# Patient Record
Sex: Male | Born: 1940 | Race: White | Hispanic: No | Marital: Married | Smoking: Never smoker
Health system: Southern US, Community
[De-identification: ages and names within clinical notes are randomized; demographics above are authoritative.]

## PROBLEM LIST (undated history)

## (undated) DIAGNOSIS — I442 Atrioventricular block, complete: Secondary | ICD-10-CM

## (undated) DIAGNOSIS — Z950 Presence of cardiac pacemaker: Secondary | ICD-10-CM

## (undated) DIAGNOSIS — E781 Pure hyperglyceridemia: Secondary | ICD-10-CM

## (undated) DIAGNOSIS — I10 Essential (primary) hypertension: Secondary | ICD-10-CM

## (undated) DIAGNOSIS — I495 Sick sinus syndrome: Secondary | ICD-10-CM

## (undated) HISTORY — DX: Pure hyperglyceridemia: E78.1

## (undated) HISTORY — DX: Essential (primary) hypertension: I10

## (undated) HISTORY — DX: Sick sinus syndrome: I49.5

## (undated) HISTORY — DX: Presence of cardiac pacemaker: Z95.0

## (undated) HISTORY — PX: PACEMAKER INSERTION: SHX728

## (undated) HISTORY — DX: Atrioventricular block, complete: I44.2

---

## 2001-01-31 ENCOUNTER — Emergency Department (HOSPITAL_COMMUNITY): Admission: EM | Admit: 2001-01-31 | Discharge: 2001-02-01 | Payer: Self-pay | Admitting: Emergency Medicine

## 2003-05-25 ENCOUNTER — Ambulatory Visit (HOSPITAL_COMMUNITY): Admission: RE | Admit: 2003-05-25 | Discharge: 2003-05-25 | Payer: Self-pay | Admitting: Gastroenterology

## 2003-05-25 ENCOUNTER — Encounter (INDEPENDENT_AMBULATORY_CARE_PROVIDER_SITE_OTHER): Payer: Self-pay | Admitting: Specialist

## 2004-09-07 ENCOUNTER — Ambulatory Visit: Payer: Self-pay

## 2004-10-04 ENCOUNTER — Ambulatory Visit: Payer: Self-pay

## 2004-10-29 ENCOUNTER — Ambulatory Visit: Payer: Self-pay | Admitting: Internal Medicine

## 2004-11-29 ENCOUNTER — Ambulatory Visit: Payer: Self-pay | Admitting: Internal Medicine

## 2005-01-02 ENCOUNTER — Ambulatory Visit: Payer: Self-pay | Admitting: Internal Medicine

## 2005-01-27 ENCOUNTER — Ambulatory Visit: Payer: Self-pay | Admitting: Internal Medicine

## 2005-03-17 ENCOUNTER — Ambulatory Visit: Payer: Self-pay | Admitting: Internal Medicine

## 2005-04-18 ENCOUNTER — Ambulatory Visit: Payer: Self-pay | Admitting: Internal Medicine

## 2005-05-24 ENCOUNTER — Ambulatory Visit: Payer: Self-pay | Admitting: Internal Medicine

## 2005-07-06 ENCOUNTER — Ambulatory Visit: Payer: Self-pay | Admitting: Internal Medicine

## 2005-09-27 ENCOUNTER — Ambulatory Visit: Payer: Self-pay | Admitting: Internal Medicine

## 2005-10-31 ENCOUNTER — Ambulatory Visit: Payer: Self-pay | Admitting: Internal Medicine

## 2005-12-06 ENCOUNTER — Ambulatory Visit: Payer: Self-pay | Admitting: Internal Medicine

## 2006-01-02 ENCOUNTER — Ambulatory Visit: Payer: Self-pay | Admitting: Internal Medicine

## 2006-02-09 ENCOUNTER — Ambulatory Visit: Payer: Self-pay | Admitting: Internal Medicine

## 2006-03-16 ENCOUNTER — Ambulatory Visit: Payer: Self-pay | Admitting: Internal Medicine

## 2006-04-13 ENCOUNTER — Ambulatory Visit: Payer: Self-pay | Admitting: Internal Medicine

## 2006-06-22 ENCOUNTER — Ambulatory Visit: Payer: Self-pay | Admitting: Internal Medicine

## 2006-07-30 ENCOUNTER — Ambulatory Visit: Payer: Self-pay | Admitting: Internal Medicine

## 2006-08-27 ENCOUNTER — Ambulatory Visit: Payer: Self-pay | Admitting: Internal Medicine

## 2006-09-18 ENCOUNTER — Ambulatory Visit: Payer: Self-pay | Admitting: Internal Medicine

## 2006-10-22 ENCOUNTER — Ambulatory Visit: Payer: Self-pay | Admitting: Internal Medicine

## 2006-11-19 ENCOUNTER — Ambulatory Visit: Payer: Self-pay | Admitting: Internal Medicine

## 2006-12-17 ENCOUNTER — Ambulatory Visit: Payer: Self-pay | Admitting: Internal Medicine

## 2007-01-16 ENCOUNTER — Ambulatory Visit: Payer: Self-pay | Admitting: Internal Medicine

## 2007-02-11 ENCOUNTER — Ambulatory Visit: Payer: Self-pay | Admitting: Internal Medicine

## 2007-03-11 ENCOUNTER — Ambulatory Visit: Payer: Self-pay | Admitting: Internal Medicine

## 2007-04-08 ENCOUNTER — Ambulatory Visit: Payer: Self-pay | Admitting: Internal Medicine

## 2007-05-06 ENCOUNTER — Ambulatory Visit: Payer: Self-pay | Admitting: Internal Medicine

## 2007-06-08 ENCOUNTER — Ambulatory Visit: Payer: Self-pay | Admitting: Internal Medicine

## 2007-07-01 ENCOUNTER — Ambulatory Visit: Payer: Self-pay | Admitting: Internal Medicine

## 2007-07-29 ENCOUNTER — Ambulatory Visit: Payer: Self-pay | Admitting: Internal Medicine

## 2007-08-26 ENCOUNTER — Ambulatory Visit: Payer: Self-pay | Admitting: Internal Medicine

## 2007-09-25 ENCOUNTER — Ambulatory Visit: Payer: Self-pay | Admitting: Internal Medicine

## 2007-10-21 ENCOUNTER — Ambulatory Visit: Payer: Self-pay | Admitting: Internal Medicine

## 2008-01-20 ENCOUNTER — Ambulatory Visit: Payer: Self-pay | Admitting: Internal Medicine

## 2008-03-16 ENCOUNTER — Ambulatory Visit: Payer: Self-pay

## 2008-03-30 ENCOUNTER — Ambulatory Visit: Payer: Self-pay | Admitting: Internal Medicine

## 2008-06-01 ENCOUNTER — Ambulatory Visit: Payer: Self-pay | Admitting: Internal Medicine

## 2008-07-06 ENCOUNTER — Ambulatory Visit: Payer: Self-pay | Admitting: Internal Medicine

## 2008-08-04 ENCOUNTER — Ambulatory Visit: Payer: Self-pay | Admitting: Internal Medicine

## 2008-08-31 ENCOUNTER — Ambulatory Visit: Payer: Self-pay | Admitting: Internal Medicine

## 2008-09-25 ENCOUNTER — Ambulatory Visit: Payer: Self-pay | Admitting: Internal Medicine

## 2008-10-26 ENCOUNTER — Encounter (INDEPENDENT_AMBULATORY_CARE_PROVIDER_SITE_OTHER): Payer: Self-pay | Admitting: *Deleted

## 2008-10-28 DIAGNOSIS — Z950 Presence of cardiac pacemaker: Secondary | ICD-10-CM | POA: Insufficient documentation

## 2008-10-28 DIAGNOSIS — I442 Atrioventricular block, complete: Secondary | ICD-10-CM

## 2008-10-28 DIAGNOSIS — I10 Essential (primary) hypertension: Secondary | ICD-10-CM

## 2008-11-02 ENCOUNTER — Ambulatory Visit: Payer: Self-pay | Admitting: Internal Medicine

## 2008-12-04 ENCOUNTER — Ambulatory Visit: Payer: Self-pay | Admitting: Internal Medicine

## 2008-12-28 ENCOUNTER — Encounter: Payer: Self-pay | Admitting: Internal Medicine

## 2008-12-28 ENCOUNTER — Ambulatory Visit: Payer: Self-pay

## 2009-01-18 ENCOUNTER — Ambulatory Visit: Payer: Self-pay | Admitting: Internal Medicine

## 2009-01-18 ENCOUNTER — Encounter: Payer: Self-pay | Admitting: Internal Medicine

## 2009-01-20 ENCOUNTER — Ambulatory Visit: Payer: Self-pay | Admitting: Internal Medicine

## 2009-01-21 LAB — CONVERTED CEMR LAB
Basophils Absolute: 0 10*3/uL (ref 0.0–0.1)
CO2: 30 meq/L (ref 19–32)
Calcium: 8.9 mg/dL (ref 8.4–10.5)
Creatinine, Ser: 0.9 mg/dL (ref 0.4–1.5)
GFR calc non Af Amer: 88.94 mL/min (ref >60)
HCT: 39.8 % (ref 39.0–52.0)
Hemoglobin: 14 g/dL (ref 13.0–17.0)
INR: 1.1 — ABNORMAL HIGH (ref 0.8–1.0)
Lymphs Abs: 1.1 10*3/uL (ref 0.7–4.0)
MCHC: 35.1 g/dL (ref 30.0–36.0)
MCV: 83.8 fL (ref 78.0–100.0)
Monocytes Absolute: 0.6 10*3/uL (ref 0.1–1.0)
Monocytes Relative: 10.7 % (ref 3.0–12.0)
Neutro Abs: 3.8 10*3/uL (ref 1.4–7.7)
Platelets: 226 10*3/uL (ref 150.0–400.0)
RDW: 12.6 % (ref 11.5–14.6)
Sodium: 141 meq/L (ref 135–145)
aPTT: 30.1 s — ABNORMAL HIGH (ref 21.7–28.8)

## 2009-01-25 ENCOUNTER — Ambulatory Visit: Payer: Self-pay | Admitting: Internal Medicine

## 2009-01-25 ENCOUNTER — Ambulatory Visit (HOSPITAL_COMMUNITY): Admission: RE | Admit: 2009-01-25 | Discharge: 2009-01-25 | Payer: Self-pay | Admitting: Internal Medicine

## 2009-02-10 ENCOUNTER — Ambulatory Visit: Payer: Self-pay | Admitting: Internal Medicine

## 2009-03-31 ENCOUNTER — Ambulatory Visit: Payer: Self-pay

## 2009-03-31 ENCOUNTER — Telehealth: Payer: Self-pay | Admitting: Internal Medicine

## 2009-04-13 ENCOUNTER — Ambulatory Visit: Payer: Self-pay | Admitting: Internal Medicine

## 2010-02-08 ENCOUNTER — Ambulatory Visit: Payer: Self-pay | Admitting: Internal Medicine

## 2010-08-05 ENCOUNTER — Encounter (INDEPENDENT_AMBULATORY_CARE_PROVIDER_SITE_OTHER): Payer: Self-pay | Admitting: *Deleted

## 2010-08-15 ENCOUNTER — Ambulatory Visit: Payer: Self-pay

## 2010-08-15 ENCOUNTER — Encounter: Payer: Self-pay | Admitting: Internal Medicine

## 2010-11-08 NOTE — Procedures (Signed)
Summary: device check   Current Medications (verified): 1)  Norvasc 10 Mg Tabs (Amlodipine Besylate) .... Take 1 Tablet By Mouth Once A Day 2)  Hyzaar 50-12.5 Mg Tabs (Losartan Potassium-Hctz) .... Take 1 Tablet By Mouth Once Daily 3)  Aspirin 81 Mg Tabs (Aspirin) .... Two Tablets Once Daily  Allergies (verified): 1)  * Penicillin  PPM Specifications Following MD:  [name], MD     PPM Vendor:  Medtronic     PPM Model Number:  ADDRL1     PPM Serial Number:  [id number] PPM DOI:  01/25/2009     PPM Implanting MD:  [name], MD  Lead 1    Location: RA     DOI: 01/20/1997     Model #: 1342T     Serial #: [id number]     Status: active Lead 2    Location: RV     DOI: 01/20/1997     Model #: 1342T     Serial #: [id number]     Status: active  Magnet Response Rate:  BOL85 ERI 65  Indications:  2:1HB  Explantation Comments:  pacer dependent 01/25/2009 Thera [id number]/[id number] h explanted  PPM Follow Up Battery Voltage:  2.79 V     Battery Est. Longevity:  11.5 yrs     Pacer Dependent:  Yes       PPM Device Measurements Atrium  Amplitude: PACED mV, Impedance: 424 ohms, Threshold: 0.50 V at 0.40 msec Right Ventricle  Amplitude: PACED mV, Impedance: 694 ohms, Threshold: 0.750 V at 0.40 msec  Episodes MS Episodes:  0     Coumadin:  No Ventricular High Rate:  0     Atrial Pacing:  95.4%     Ventricular Pacing:  99.9%  Parameters Mode:  DDDR     Lower Rate Limit:  60     Upper Rate Limit:  140 Paced AV Delay:  150     Sensed AV Delay:  120 Next Cardiology Appt Due:  02/07/2011 Tech Comments:  NORMAL DEVICE FUNCTION.  PACER DEPENDENT ON TODAYS CHECK.  CHANGED RA OUTPUT FROM 1.5 TO 2.0 AND RV OUTPUT FROM 2.0 TO 2.5 V.  ROV IN 6 MTHS W/SK.  Vella Kohler  August 15, 2010 9:30 AM

## 2010-11-08 NOTE — Assessment & Plan Note (Signed)
Summary: pacer check   Visit Type:  Pacemaker check Primary Provider:  dr.John Valentina Lucks  CC:  no complaints.  History of Present Illness: Luke Schmidt is seen following pacemaker implantation years ago for complete heart block; He also has intermittent sinus node arrest.  He underwent device generator replacement about a year ago.  At that time preprocedural echo demonstrated an ejection fraction of 60%  The patient denies SOB, chest pain, edema or palpitations   Current Medications (verified): 1)  Norvasc 10 Mg Tabs (Amlodipine Besylate) .... Take 1 Tablet By Mouth Once A Day 2)  Hyzaar 50-12.5 Mg Tabs (Losartan Potassium-Hctz) .... Verify Dosage 3)  Aspirin 81 Mg Tabs (Aspirin) .... Two Tablets Once Daily  Allergies (verified): 1)  * Penicillin  Past History:  Past Medical History: Last updated: 10/28/2008 Complete Heart Block Sinus Node Dysfunction Hypertension status post Medtronic pacemaker hypertriglyceridemia  Vital Signs:  Patient profile:   70 year old male Height:      72 inches Weight:      209 pounds BMI:     28.45 Pulse rate:   73 / minute BP sitting:   126 / 78  (left arm) Cuff size:   large  Vitals Entered By: Luke Kanaris, CNA (Feb 08, 2010 3:08 PM)  Physical Exam  General:  The patient was alert and oriented in no acute distress. HEENT Normal.  Neck veins were flat, carotids were brisk.  Lungs were clear.  Heart sounds were regular without murmurs or gallops.  Abdomen was soft with active bowel sounds. There is no clubbing cyanosis or edema. Skin Warm and dry    PPM Specifications Following MD:  [name], MD     PPM Vendor:  Medtronic     PPM Model Number:  ADDRL1     PPM Serial Number:  [id number] PPM DOI:  01/25/2009     PPM Implanting MD:  [name], MD  Lead 1    Location: RA     DOI: 01/20/1997     Model #: 1342T     Serial #: [id number]     Status: active Lead 2    Location: RV     DOI: 01/20/1997     Model #: 1342T     Serial  #: [id number]     Status: active  Magnet Response Rate:  BOL85 ERI 65  Indications:  2:1HB  Explantation Comments:  pacer dependent 01/25/2009 Thera 7950i/[id number] h explanted  PPM Follow Up Remote Check?  No Battery Voltage:  2.79 V     Battery Est. Longevity:  12.5 years     Pacer Dependent:  Yes       PPM Device Measurements Atrium  Impedance: 424 ohms, Threshold: 0.5 V at 0.4 msec Right Ventricle  Impedance: 665 ohms, Threshold: 0.625 V at 0.4 msec  Episodes MS Episodes:  3     Percent Mode Switch:  <0.1%     Coumadin:  No Ventricular High Rate:  0     Atrial Pacing:  93.9%     Ventricular Pacing:  100%  Parameters Mode:  DDDR     Lower Rate Limit:  60     Upper Rate Limit:  140 Paced AV Delay:  150     Sensed AV Delay:  120 Next Cardiology Appt Due:  08/09/2010 Tech Comments:  No parameter changes.  Device function normal.  No Carelink @ this time.  ROV 6 months clinic. Altha Harm, LPN  Feb 09, 8412 3:14 PM  Impression & Recommendations:  Problem # 1:  AV BLOCK, COMPLETE (ICD-426.0) Stable with nearly 100% ventricular pacing His updated medication list for this problem includes:    Norvasc 10 Mg Tabs (Amlodipine besylate) .Marland Kitchen... Take 1 tablet by mouth once a day    Aspirin 81 Mg Tabs (Aspirin) .Marland Kitchen..Marland Kitchen Two tablets once daily  His updated medication list for this problem includes:    Norvasc 10 Mg Tabs (Amlodipine besylate) .Marland Kitchen... Take 1 tablet by mouth once a day    Aspirin 81 Mg Tabs (Aspirin) .Marland Kitchen..Marland Kitchen Two tablets once daily  Problem # 2:  PACEMAKER (ICD-V45.Marland Kitchen01) Device parameters and data were reviewed and no changes were made  We also spent time reviewing the potential issues related to permanent pacing specifically pacing induced cardiomyopathy. Review the results of his echo from a year ago demonstrating normal left ventricular function prior to generator replacement and we anticipate repeating the ultrasound at his visit next year  Problem # 3:  HYPERTENSION, BENIGN  (ICD-401.1) stable His updated medication list for this problem includes:    Norvasc 10 Mg Tabs (Amlodipine besylate) .Marland Kitchen... Take 1 tablet by mouth once a day    Hyzaar 50-12.5 Mg Tabs (Losartan potassium-hctz) .Marland Kitchen... Verify dosage    Aspirin 81 Mg Tabs (Aspirin) .Marland Kitchen..Marland Kitchen Two tablets once daily  Patient Instructions: 1)  Followup in 6 months in the device clinic. 2)  Your physician wants you to follow-up in:  1 year with Dr. Graciela Husbands. You will receive a reminder letter in the mail two months in advance. If you don't receive a letter, please call our office to schedule the follow-up appointment.

## 2010-11-08 NOTE — Miscellaneous (Signed)
Summary: correcting codes  Clinical Lists Changes  Problems: Changed problem from PACEMAKER (ICD-V45.Marland Kitchen01) to PACEMAKER, PERMANENT (ICD-V45.01) changed the incorrect dx code to correct dx code Genella Mech  August 05, 2010 9:59 AM

## 2010-11-08 NOTE — Cardiovascular Report (Signed)
Summary: Office Visit   Office Visit   Imported By: Roderic Ovens 08/18/2010 14:29:37  _____________________________________________________________________  External Attachment:    Type:   Image     Comment:   External Document

## 2011-02-21 NOTE — Assessment & Plan Note (Signed)
[facility]                         ELECTROPHYSIOLOGY OFFICE NOTE   DIANE, MOCHIZUKI                    MRN:          [id number]  DATE:09/25/2008                            DOB:          1941/06/28    HISTORY OF PRESENT ILLNESS:  Mr. Titsworth is seen in followup for a  pacemaker implanted for 2-1 heart block.  He is now become device-  dependent.  He has no history of coronary artery disease.  He has had no  problems with chest pain, shortness of breath, or changes in exercise  tolerance.   MEDICATION:  His only medications includes Hyzaar 100/25, Norvasc 10,  and aspirin.   PHYSICAL EXAMINATION:  VITAL SIGNS:  His blood pressure is well-  controlled at 128/77, his weight is 211, which is up 15 pounds in the  last 2 years, his pulse was 59.  LUNGS:  Clear.  NECK:  Veins were flat.  HEART:  Sounds were regular.  EXTREMITIES:  Without edema.   IMPRESSION:  1. 2:1 heart block, now device dependent.  2. Status post Medtronic pacer for the above, now approaching the      elective replacement indicator with approximately 4 months of      longevity.  3. Hypertension.   PLAN:  We will plan to check an echo at his next visit in 3 months, as  he approaches ERI to make sure there is no significant changes in LV  function related to pacing.   His blood pressure is well controlled.  I should note that his impedance  on A lead 425 in the V lead over 643.     Duke Salvia, MD, [facility]  Electronically Signed    SCK/MedQ  DD: 09/25/2008  DT: 09/25/2008  Job #: [id number]

## 2011-02-21 NOTE — Letter (Signed)
September 25, 2007    Thora Lance, M.D.  [address]  [place], [place] [address]   RE:  Luke Schmidt, Luke Schmidt  MRN:  [id number]  /  DOB:  [birth year]   Dear Luke Schmidt,   I hope this letter finds you well and hope you and your family have a  Merry Christmas.   Luke Schmidt comes in today.  He is now ten years status post pacemaker  implantation for high-grade heart block and he remains device-dependent.  He has some degree of chronotropic incompetence, but no limitations in  his functional capacity.   His medications today include Hyzaar 100, presumably/12.5, and Norvasc  10.   EXAMINATION:  His blood pressure is elevated at 148/89 and, as I talk to  him at home, he typically runs in the 145/85 range.  His lungs were clear.  Heart sounds were regular.  The extremities were without edema.   Interrogation of his Medtronic CR pulse generator demonstrates a P-wave  that was not recordable today.  The impedance was 438, a threshold of  0.5 at 0.4.  That was also the ventricular threshold with impedance of  646 and there is NO INTRINSIC VENTRICULAR RHYTHM.   IMPRESSION:  1. Complete heart block.  2. Intermittent sinus node arrest.  3. Status post pacemaker for #1 and #2.  4. Hypertension - persistent.   Luke Schmidt, Mr. Kana's pacemaker is doing well.  I have asked him to try  and get a dozen or two measurements of his blood pressure, so that, when  he comes to see you next month, you will have a chance to review some  ongoing data.  I am concerned that his blood pressure in the 145/85  range is probably too high for him, given his age, and so I wanted him  to get some data points that might facilitate your care of that.   We will see him again in six months' time.    Sincerely,      Luke Salvia, MD, [facility]  Electronically Signed    SCK/MedQ  DD: 09/25/2007  DT: 09/25/2007  Job #: [id number]

## 2011-02-24 NOTE — Op Note (Signed)
NAME:  UNIQUE, SEARFOSS NO.:  [id number]   MEDICAL RECORD NO.:  [id number]          PATIENT TYPE:  OIB   LOCATION:  2899                         FACILITY:  [facility]   PHYSICIAN:  Duke Salvia, MD, FACCDATE OF BIRTH:  03-16-1941   DATE OF PROCEDURE:  02/24/2009  DATE OF DISCHARGE:  01/25/2009                               OPERATIVE REPORT   SURGEON:  Duke Salvia, MD, [facility]   PREOPERATIVE DIAGNOSIS:  Previously implanted pacemaker, now at elective  replacement indicator.   POSTOPERATIVE DIAGNOSIS:  Previously implanted pacemaker, now at  elective replacement indicator with underlying complete heart block.   Following obtaining informed consent, the patient was brought to the  Electrophysiology Laboratory and placed on the fluoroscopic table in a  supine position.  After routine prep and drape, lidocaine was  infiltrated along the line of previous incision and carried down to the  device pocket.  The pocket was opened up.  The previously implanted  device was explanted.  Interrogation of the atrial lead demonstrated a  1342 lead with an amplitude of 7.1, impedance of 402, a threshold of 0.3  at 0.5, current threshold is 1.0 MA.  The RV lead had no intrinsic  rhythm.  The impedance was 656, threshold was 0.5 at 0.5 with a current  of 0.8 MA.  The leads were attached to an Adapta pulse generator, serial  number [id number].  Ventricular pacing and then P synchronous pacing  were identified.  The pocket was copiously irrigated with antibiotic-  containing saline solution.  Hemostasis was assured.  Leads and pulse  generator were placed in the pocket, secured to the prepectoral fascia.  The wound was closed in 3 layers in normal fashion.  The wound was  washed, dried, and a benzoin Steri-Strip dressing was applied.  Needle  counts, sponge counts, and instrument counts were correct at the end of  the procedure according to the staff.  The patient tolerated the  procedure without apparent complication.      Duke Salvia, MD, [facility]  Electronically Signed     SCK/MEDQ  D:  02/24/2009  T:  02/25/2009  Job:  [id number]

## 2011-02-24 NOTE — Op Note (Signed)
   NAME:  Luke Schmidt, Luke Schmidt                       ACCOUNT NO.:  [id number]   MEDICAL RECORD NO.:  [id number]                   PATIENT TYPE:  AMB   LOCATION:  ENDO                                 [facility]ACILITY:  [facility]   PHYSICIAN:  Danise Edge, M.D.                DATE OF BIRTH:  01-29-1941   DATE OF PROCEDURE:  05/25/2003  DATE OF DISCHARGE:                                 OPERATIVE REPORT   PROCEDURE:  Screening colonoscopy with polypectomy.   PROCEDURE INDICATION:  Luke Schmidt is a 70 year old male born Feb 07, 1941.  Mr. Luke Schmidt is scheduled to undergo his first screening colonoscopy  with polypectomy to prevent colon cancer.   ENDOSCOPIST:  Danise Edge, M.D.   PREMEDICATION:  Versed 7.5 mg, Demerol 50 mg.   PROCEDURE:  After obtaining informed consent, Mr. Luke Schmidt was placed in  the left lateral decubitus position.  I administered intravenous Demerol and  intravenous Versed to achieve conscious sedation for the procedure.  The  patient's blood pressure, oxygen saturation, and cardiac rhythm were  monitored throughout the procedure and documented in the medical record.   Anal inspection was normal.  Digital rectal exam was normal.  The prostate  was non-nodular.  The Olympus adult colonoscope was introduced into the  rectum and easily advanced to the cecum.  Colonic preparation for the exam  today was excellent.   RECTUM:  Normal.   SIGMOID COLON AND DESCENDING COLON:  Normal.   SPLENIC FLEXURE:  Normal.   TRANSVERSE COLON:  Normal.   HEPATIC FLEXURE:  Normal.   ASCENDING COLON:  Normal.   CECUM AND ILEOCECAL VALVE:  A 2 mm sessile polyp was removed from the  proximal cecum with the electrocautery snare and submitted for pathological  interpretation.   ASSESSMENT:  A 2 mm diameter polyp was removed from the proximal cecum;  otherwise, normal proctocolonoscopy to the cecum.    RECOMMENDATIONS:  If cecal polyp returns neoplastic pathologically, Mr.  Schmidt should undergo a repeat colonoscopy in five years.                                               Danise Edge, M.D.    MJ/MEDQ  D:  05/25/2003  T:  05/25/2003  Job:  [id number]   cc:   Thora Lance, M.D.  [address]  [place]  [place] [address]  Fax: [phone]

## 2011-02-24 NOTE — Letter (Signed)
September 18, 2006    Thora Lance, M.D.  [address]  [place], [place] [address]   RE:  Luke Schmidt, Luke Schmidt  MRN:  [id number]  /  DOB:  [birth year]   Dear Jonny Ruiz:   I hope this letter finds you and your family well at Christmas time.  Mr. Kuiken came in today.  his pacemaker is doing fine.  It really is  having remarkable  longevity.  He has no complaints.  The issue really  is his blood pressure, which today was 148/83 and has been typically  high.  He mentions it was high when you saw him in June, at which time  you increased his Hyzaar.  It is now at 100 mg a day.  He is also taking  Norvasc 10 mg.   Otherwise, his lungs are clear.  Heart sounds are regular.  EXTREMITIES:  Without edema.   Interrogation of his Medtronic device demonstrated an estimated  longevity is 26 months, which I think is pretty unbelievable.  His  battery voltage is 2.76, atrial impedence was 453, ventricular impedence  was 642, and he is device dependent.  Heart rate excursion was adequate.   IMPRESSION:  1. Complete heart block.  2. Status post pacer for #1.  3. Hypertension.   We discussed non-medicinal interventions, like salt and exercise, both  of which he is trying to do a pretty good job on.  He does not think he  snores.   As he is already on 3 medicines, I suggested that it did not make sense  for me to do anything urgently at this point, but he is supposed to see  you next month and I did want to alert you to his blood pressure.   We will see him again in 1 year and will follow his device remotely in  the interim.    Sincerely,      Duke Salvia, MD, [facility]  Electronically Signed    SCK/MedQ  DD: 09/18/2006  DT: 09/18/2006  Job #: [id number]

## 2011-02-26 ENCOUNTER — Other Ambulatory Visit: Payer: Self-pay | Admitting: Internal Medicine

## 2011-04-03 ENCOUNTER — Encounter: Payer: Self-pay | Admitting: Internal Medicine

## 2011-05-30 ENCOUNTER — Ambulatory Visit (INDEPENDENT_AMBULATORY_CARE_PROVIDER_SITE_OTHER): Payer: Medicare Other | Admitting: Internal Medicine

## 2011-05-30 ENCOUNTER — Encounter: Payer: Self-pay | Admitting: Internal Medicine

## 2011-05-30 VITALS — BP 142/86 | HR 74 | Ht 72.5 in | Wt 213.0 lb

## 2011-05-30 DIAGNOSIS — I495 Sick sinus syndrome: Secondary | ICD-10-CM | POA: Insufficient documentation

## 2011-05-30 DIAGNOSIS — Z950 Presence of cardiac pacemaker: Secondary | ICD-10-CM

## 2011-05-30 DIAGNOSIS — I442 Atrioventricular block, complete: Secondary | ICD-10-CM

## 2011-05-30 LAB — PACEMAKER DEVICE OBSERVATION
AL IMPEDENCE PM: 407 Ohm
AL THRESHOLD: 0.5 V
ATRIAL PACING PM: 97
BAMS-0001: 175 {beats}/min
RV LEAD IMPEDENCE PM: 697 Ohm
VENTRICULAR PACING PM: 100

## 2011-05-30 NOTE — Assessment & Plan Note (Signed)
100% atrial paced

## 2011-05-30 NOTE — Progress Notes (Signed)
  HPI  Luke Schmidt is a 70 y.o. male Seen in followup for pacemaker implantation years ago for complete heart block. He recently underwent generator replacement. The patient denies chest pain, shortness of breath, nocturnal dyspnea, orthopnea or peripheral edema.  There have been no palpitations, lightheadedness or syncope.    Past Medical History  Diagnosis Date  . Complete heart block   . Sinus node dysfunction   . HTN (hypertension)   . Hypertriglyceridemia     Past Surgical History  Procedure Date  . Pacemaker insertion     Medtronic     Current Outpatient Prescriptions  Medication Sig Dispense Refill  . amLODipine (NORVASC) 10 MG tablet TAKE 1 TABLET BY MOUTH ONCE DAILY  30 tablet  4  . aspirin 81 MG EC tablet Take 162 mg by mouth daily.        Marland Kitchen losartan-hydrochlorothiazide (HYZAAR) 50-12.5 MG per tablet Take 1 tablet by mouth daily.          Allergies  Allergen Reactions  . Penicillins     Review of Systems negative except from HPI and PMH  Physical Exam Well developed and well nourished in no acute distress HENT normal E scleral and icterus clear Neck Supple JVP flat; carotids brisk and full Clear to ausculation Regular rate and rhythm, no murmurs gallops or rub Soft with active bowel sounds No clubbing cyanosis and edema Alert and oriented, grossly normal motor and sensory function Skin Warm and Dry   Assessment and  Plan

## 2011-05-30 NOTE — Assessment & Plan Note (Addendum)
The patient's device was interrogated.  The information was reviewed. No changes were made in the programming.    Will begin carelink

## 2011-05-30 NOTE — Assessment & Plan Note (Signed)
Stable

## 2011-07-29 ENCOUNTER — Other Ambulatory Visit: Payer: Self-pay | Admitting: Internal Medicine

## 2011-09-01 ENCOUNTER — Encounter: Payer: Medicare Other | Admitting: *Deleted

## 2011-09-02 ENCOUNTER — Encounter: Payer: Self-pay | Admitting: *Deleted

## 2011-09-11 ENCOUNTER — Ambulatory Visit (INDEPENDENT_AMBULATORY_CARE_PROVIDER_SITE_OTHER): Payer: Medicare Other | Admitting: *Deleted

## 2011-09-11 ENCOUNTER — Encounter: Payer: Self-pay | Admitting: Internal Medicine

## 2011-09-11 ENCOUNTER — Other Ambulatory Visit: Payer: Self-pay | Admitting: Internal Medicine

## 2011-09-11 DIAGNOSIS — I495 Sick sinus syndrome: Secondary | ICD-10-CM

## 2011-09-11 DIAGNOSIS — I442 Atrioventricular block, complete: Secondary | ICD-10-CM

## 2011-09-11 DIAGNOSIS — Z950 Presence of cardiac pacemaker: Secondary | ICD-10-CM

## 2011-09-13 LAB — REMOTE PACEMAKER DEVICE
AL IMPEDENCE PM: 413 Ohm
ATRIAL PACING PM: 97
BAMS-0001: 175 {beats}/min
RV LEAD IMPEDENCE PM: 730 Ohm
VENTRICULAR PACING PM: 99

## 2011-09-13 NOTE — Progress Notes (Signed)
Remote pacer check  

## 2011-09-14 ENCOUNTER — Encounter: Payer: Self-pay | Admitting: *Deleted

## 2011-12-14 ENCOUNTER — Ambulatory Visit (INDEPENDENT_AMBULATORY_CARE_PROVIDER_SITE_OTHER): Payer: Medicare Other | Admitting: *Deleted

## 2011-12-14 ENCOUNTER — Encounter: Payer: Self-pay | Admitting: Internal Medicine

## 2011-12-14 DIAGNOSIS — I495 Sick sinus syndrome: Secondary | ICD-10-CM

## 2011-12-14 DIAGNOSIS — I442 Atrioventricular block, complete: Secondary | ICD-10-CM

## 2011-12-15 LAB — REMOTE PACEMAKER DEVICE
AL IMPEDENCE PM: 407 Ohm
ATRIAL PACING PM: 97
BATTERY VOLTAGE: 2.79 V
VENTRICULAR PACING PM: 100

## 2011-12-20 ENCOUNTER — Encounter: Payer: Self-pay | Admitting: Internal Medicine

## 2011-12-20 NOTE — Progress Notes (Signed)
Remote pacer check  

## 2011-12-26 ENCOUNTER — Encounter: Payer: Self-pay | Admitting: *Deleted

## 2011-12-31 ENCOUNTER — Other Ambulatory Visit: Payer: Self-pay | Admitting: Internal Medicine

## 2012-03-01 ENCOUNTER — Other Ambulatory Visit: Payer: Self-pay | Admitting: Dermatology

## 2012-03-14 ENCOUNTER — Encounter: Payer: Medicare Other | Admitting: *Deleted

## 2012-03-25 ENCOUNTER — Encounter: Payer: Self-pay | Admitting: *Deleted

## 2012-04-05 ENCOUNTER — Ambulatory Visit (INDEPENDENT_AMBULATORY_CARE_PROVIDER_SITE_OTHER): Payer: Medicare Other | Admitting: *Deleted

## 2012-04-05 DIAGNOSIS — I495 Sick sinus syndrome: Secondary | ICD-10-CM

## 2012-04-05 DIAGNOSIS — I442 Atrioventricular block, complete: Secondary | ICD-10-CM

## 2012-04-06 ENCOUNTER — Encounter: Payer: Self-pay | Admitting: Internal Medicine

## 2012-04-08 LAB — REMOTE PACEMAKER DEVICE
ATRIAL PACING PM: 97
BATTERY VOLTAGE: 2.79 V
VENTRICULAR PACING PM: 100

## 2012-04-19 ENCOUNTER — Encounter: Payer: Self-pay | Admitting: *Deleted

## 2012-05-28 ENCOUNTER — Other Ambulatory Visit: Payer: Self-pay | Admitting: Internal Medicine

## 2012-05-28 ENCOUNTER — Ambulatory Visit (INDEPENDENT_AMBULATORY_CARE_PROVIDER_SITE_OTHER): Payer: Medicare Other | Admitting: Internal Medicine

## 2012-05-28 ENCOUNTER — Encounter: Payer: Self-pay | Admitting: Internal Medicine

## 2012-05-28 VITALS — BP 140/84 | HR 85 | Ht 72.0 in | Wt 214.8 lb

## 2012-05-28 DIAGNOSIS — I442 Atrioventricular block, complete: Secondary | ICD-10-CM

## 2012-05-28 DIAGNOSIS — Z950 Presence of cardiac pacemaker: Secondary | ICD-10-CM

## 2012-05-28 DIAGNOSIS — I495 Sick sinus syndrome: Secondary | ICD-10-CM

## 2012-05-28 LAB — PACEMAKER DEVICE OBSERVATION
AL IMPEDENCE PM: 402 Ohm
ATRIAL PACING PM: 97.7
BAMS-0001: 175 {beats}/min

## 2012-05-28 NOTE — Assessment & Plan Note (Signed)
The patient's device was interrogated.  The information was reviewed. No changes were made in the programming.    

## 2012-05-28 NOTE — Patient Instructions (Addendum)
Remote monitoring is used to monitor your Pacemaker of ICD from home. This monitoring reduces the number of office visits required to check your device to one time per year. It allows [place] to keep an eye on the functioning of your device to ensure it is working properly. You are scheduled for a device check from home on September 02, 2012. You may send your transmission at any time that day. If you have a wireless device, the transmission will be sent automatically. After your physician reviews your transmission, you will receive a postcard with your next transmission date.  Your physician wants you to follow-up in: 1 year with Dr Graciela Husbands.  You will receive a reminder letter in the mail two months in advance. If you don't receive a letter, please call our office to schedule the follow-up appointment.  Your physician recommends that you continue on your current medications as directed. Please refer to the Current Medication list given to you today.

## 2012-05-28 NOTE — Assessment & Plan Note (Signed)
The patient's device was interrogated.  The information was reviewed. No changes were made in the programming.   stable

## 2012-05-28 NOTE — Progress Notes (Signed)
  HPI  Luke Schmidt is a 71 y.o. male Seen in followup for pacemaker implantation years ago for complete heart block. He  underwent generator replacement 2010. The patient denies chest pain, shortness of breath, nocturnal dyspnea, orthopnea or peripheral edema. There have been no palpitations, lightheadedness or syncope.    Past Medical History  Diagnosis Date  . Complete heart block   . Sinus node dysfunction   . HTN (hypertension)   . Hypertriglyceridemia   . Pacemaker     Medtronic adapta  L; generator replacement 2010    Past Surgical History  Procedure Date  . Pacemaker insertion     Medtronic     Current Outpatient Prescriptions  Medication Sig Dispense Refill  . amLODipine (NORVASC) 10 MG tablet take 1 tablet by mouth once daily  30 tablet  4  . aspirin 81 MG EC tablet Take 162 mg by mouth daily.        Marland Kitchen losartan-hydrochlorothiazide (HYZAAR) 50-12.5 MG per tablet Take 1 tablet by mouth daily.          Allergies  Allergen Reactions  . Penicillins     Review of Systems negative except from HPI and PMH  Physical Exam BP 140/84  Pulse 85  Ht 6' (1.829 m)  Wt 214 lb 12.8 oz (97.433 kg)  BMI 29.13 kg/m2 Well developed and well nourished in no acute distress HENT normal E scleral and icterus clear Neck Supple JVP flat; carotids brisk and full Clear to ausculation Regular rate and rhythm, no murmurs gallops or rub Soft with active bowel sounds No clubbing cyanosis none Edema Alert and oriented, grossly normal motor and sensory function Skin Warm and Dry  ecg AV  pacing  Assessment and  Plan

## 2012-06-27 ENCOUNTER — Other Ambulatory Visit: Payer: Self-pay | Admitting: Dermatology

## 2012-09-02 ENCOUNTER — Encounter: Payer: Medicare Other | Admitting: *Deleted

## 2012-09-02 ENCOUNTER — Other Ambulatory Visit: Payer: Self-pay | Admitting: Dermatology

## 2012-09-04 ENCOUNTER — Ambulatory Visit (INDEPENDENT_AMBULATORY_CARE_PROVIDER_SITE_OTHER): Payer: Medicare Other | Admitting: *Deleted

## 2012-09-04 ENCOUNTER — Encounter: Payer: Self-pay | Admitting: Internal Medicine

## 2012-09-04 DIAGNOSIS — Z950 Presence of cardiac pacemaker: Secondary | ICD-10-CM

## 2012-09-04 DIAGNOSIS — I441 Atrioventricular block, second degree: Secondary | ICD-10-CM

## 2012-09-04 DIAGNOSIS — I442 Atrioventricular block, complete: Secondary | ICD-10-CM

## 2012-09-10 ENCOUNTER — Encounter: Payer: Self-pay | Admitting: *Deleted

## 2012-09-16 ENCOUNTER — Other Ambulatory Visit: Payer: Self-pay | Admitting: Dermatology

## 2012-09-16 LAB — REMOTE PACEMAKER DEVICE
BAMS-0001: 175 {beats}/min
BATTERY VOLTAGE: 2.79 V
RV LEAD THRESHOLD: 0.5 V

## 2012-09-18 ENCOUNTER — Telehealth: Payer: Self-pay | Admitting: Internal Medicine

## 2012-09-18 NOTE — Telephone Encounter (Signed)
LMOM/kwm  

## 2012-09-18 NOTE — Telephone Encounter (Signed)
plz return call to pt [phone] regarding device check and questions

## 2012-09-23 NOTE — Telephone Encounter (Signed)
Transmission was received on 09-18-12/kwm

## 2012-09-24 ENCOUNTER — Encounter: Payer: Self-pay | Admitting: *Deleted

## 2012-10-28 ENCOUNTER — Other Ambulatory Visit: Payer: Self-pay

## 2012-10-28 MED ORDER — AMLODIPINE BESYLATE 10 MG PO TABS: 10.0000 mg | ORAL_TABLET | Freq: Every day | ORAL | Status: DC

## 2012-11-27 ENCOUNTER — Other Ambulatory Visit: Payer: Self-pay | Admitting: Dermatology

## 2012-12-16 ENCOUNTER — Encounter: Payer: Medicare Other | Admitting: *Deleted

## 2012-12-24 ENCOUNTER — Encounter: Payer: Self-pay | Admitting: *Deleted

## 2013-01-01 ENCOUNTER — Other Ambulatory Visit: Payer: Self-pay

## 2013-01-01 ENCOUNTER — Ambulatory Visit (INDEPENDENT_AMBULATORY_CARE_PROVIDER_SITE_OTHER): Payer: Medicare Other | Admitting: *Deleted

## 2013-01-01 ENCOUNTER — Encounter: Payer: Self-pay | Admitting: Internal Medicine

## 2013-01-01 DIAGNOSIS — Z950 Presence of cardiac pacemaker: Secondary | ICD-10-CM

## 2013-01-01 DIAGNOSIS — I442 Atrioventricular block, complete: Secondary | ICD-10-CM

## 2013-01-08 LAB — REMOTE PACEMAKER DEVICE
BATTERY VOLTAGE: 2.79 V
RV LEAD THRESHOLD: 0.5 V
VENTRICULAR PACING PM: 100

## 2013-02-06 ENCOUNTER — Encounter: Payer: Self-pay | Admitting: *Deleted

## 2013-03-05 ENCOUNTER — Other Ambulatory Visit: Payer: Self-pay | Admitting: Dermatology

## 2013-04-07 ENCOUNTER — Ambulatory Visit (INDEPENDENT_AMBULATORY_CARE_PROVIDER_SITE_OTHER): Payer: Medicare Other | Admitting: *Deleted

## 2013-04-07 ENCOUNTER — Encounter: Payer: Self-pay | Admitting: Internal Medicine

## 2013-04-07 DIAGNOSIS — Z950 Presence of cardiac pacemaker: Secondary | ICD-10-CM

## 2013-04-07 DIAGNOSIS — I442 Atrioventricular block, complete: Secondary | ICD-10-CM

## 2013-04-08 LAB — REMOTE PACEMAKER DEVICE
AL THRESHOLD: 0.5 V
BAMS-0001: 175 {beats}/min
VENTRICULAR PACING PM: 100

## 2013-04-16 ENCOUNTER — Encounter: Payer: Self-pay | Admitting: *Deleted

## 2013-05-29 ENCOUNTER — Other Ambulatory Visit: Payer: Self-pay | Admitting: *Deleted

## 2013-05-29 MED ORDER — AMLODIPINE BESYLATE 10 MG PO TABS: 10.0000 mg | ORAL_TABLET | Freq: Every day | ORAL | Status: DC

## 2013-06-17 ENCOUNTER — Encounter: Payer: Self-pay | Admitting: Internal Medicine

## 2013-06-17 ENCOUNTER — Ambulatory Visit (INDEPENDENT_AMBULATORY_CARE_PROVIDER_SITE_OTHER): Payer: Medicare Other | Admitting: Internal Medicine

## 2013-06-17 VITALS — BP 143/88 | HR 64 | Ht 72.0 in | Wt 212.2 lb

## 2013-06-17 DIAGNOSIS — I442 Atrioventricular block, complete: Secondary | ICD-10-CM

## 2013-06-17 DIAGNOSIS — I10 Essential (primary) hypertension: Secondary | ICD-10-CM

## 2013-06-17 DIAGNOSIS — Z950 Presence of cardiac pacemaker: Secondary | ICD-10-CM

## 2013-06-17 DIAGNOSIS — I495 Sick sinus syndrome: Secondary | ICD-10-CM

## 2013-06-17 LAB — PACEMAKER DEVICE OBSERVATION
AL THRESHOLD: 0.5 V
BAMS-0001: 175 {beats}/min
RV LEAD IMPEDENCE PM: 743 Ohm
RV LEAD THRESHOLD: 0.75 V
VENTRICULAR PACING PM: 100

## 2013-06-17 NOTE — Assessment & Plan Note (Signed)
The patient's device was interrogated.  The information was reviewed. No changes were made in the programming.    

## 2013-06-17 NOTE — Progress Notes (Signed)
  HPI  Luke Schmidt is a 72 y.o. male Seen in followup for pacemaker implantation years ago for complete heart block. He  underwent generator replacement 2010. The patient denies chest pain, shortness of breath, nocturnal dyspnea, orthopnea or peripheral edema. There have been no palpitations, lightheadedness or syncope.    Past Medical History  Diagnosis Date  . Complete heart block   . Sinus node dysfunction   . HTN (hypertension)   . Hypertriglyceridemia   . Pacemaker     Medtronic adapta  L; generator replacement 2010    Past Surgical History  Procedure Laterality Date  . Pacemaker insertion      Medtronic     Current Outpatient Prescriptions  Medication Sig Dispense Refill  . amLODipine (NORVASC) 10 MG tablet Take 1 tablet (10 mg total) by mouth daily.  30 tablet  0  . aspirin 81 MG EC tablet Take 162 mg by mouth daily.        Marland Kitchen losartan-hydrochlorothiazide (HYZAAR) 50-12.5 MG per tablet Take 1 tablet by mouth daily.         No current facility-administered medications for this visit.    Allergies  Allergen Reactions  . Penicillins     Review of Systems negative except from HPI and PMH  Physical Exam Well developed and nourished in no acute distress HENT normal Neck supple with JVP-flat Clear Regular rate and rhythm, no murmurs or gallops Abd-soft with active BS No Clubbing cyanosis edema Skin-warm and dry A & Oriented  Grossly normal sensory and motor function  ecg AV  pacing  Assessment and  Plan

## 2013-06-17 NOTE — Patient Instructions (Addendum)
Remote monitoring is used to monitor your Pacemaker of ICD from home. This monitoring reduces the number of office visits required to check your device to one time per year. It allows [place] to keep an eye on the functioning of your device to ensure it is working properly. You are scheduled for a device check from home on 3 months. You may send your transmission at any time that day. If you have a wireless device, the transmission will be sent automatically. After your physician reviews your transmission, you will receive a postcard with your next transmission date.   Your physician wants you to follow-up in: one year with Dr. Graciela Husbands . You will receive a reminder letter in the mail two months in advance. If you don't receive a letter, please call our office to schedule the follow-up appointment.   Your physician recommends that you continue on your current medications as directed. Please refer to the Current Medication list given to you today.

## 2013-06-17 NOTE — Assessment & Plan Note (Signed)
Stable and dependent

## 2013-06-17 NOTE — Assessment & Plan Note (Signed)
100% atrially paced. He has no limitations in exercise tolerance. His heart rate response was somewhat blunted but given the absence of symptoms unchanged

## 2013-06-17 NOTE — Assessment & Plan Note (Signed)
Well controlled. He is on a diuretic and his potassium level was apparently low. Potassium repletion was ordered. Those labs are pending.

## 2013-06-23 ENCOUNTER — Encounter: Payer: Self-pay | Admitting: *Deleted

## 2013-07-01 ENCOUNTER — Other Ambulatory Visit: Payer: Self-pay

## 2013-07-01 MED ORDER — AMLODIPINE BESYLATE 10 MG PO TABS: 10.0000 mg | ORAL_TABLET | Freq: Every day | ORAL | Status: DC

## 2013-07-09 ENCOUNTER — Encounter: Payer: Self-pay | Admitting: Internal Medicine

## 2013-08-14 ENCOUNTER — Other Ambulatory Visit: Payer: Self-pay

## 2013-09-22 ENCOUNTER — Ambulatory Visit (INDEPENDENT_AMBULATORY_CARE_PROVIDER_SITE_OTHER): Payer: Medicare Other | Admitting: *Deleted

## 2013-09-22 DIAGNOSIS — I442 Atrioventricular block, complete: Secondary | ICD-10-CM

## 2013-09-22 DIAGNOSIS — Z950 Presence of cardiac pacemaker: Secondary | ICD-10-CM

## 2013-09-22 DIAGNOSIS — I495 Sick sinus syndrome: Secondary | ICD-10-CM

## 2013-09-23 LAB — MDC_IDC_ENUM_SESS_TYPE_REMOTE
Battery Remaining Longevity: 83 mo
Brady Statistic AP VP Percent: 100 %
Brady Statistic AP VS Percent: 0 %
Brady Statistic AS VP Percent: 0 %
Date Time Interrogation Session: 20141216014422
Lead Channel Impedance Value: 418 Ohm
Lead Channel Impedance Value: 703 Ohm
Lead Channel Pacing Threshold Amplitude: 0.5 V
Lead Channel Pacing Threshold Amplitude: 0.5 V
Lead Channel Pacing Threshold Pulse Width: 0.4 ms
Lead Channel Setting Pacing Amplitude: 2.5 V
Lead Channel Setting Sensing Sensitivity: 2.8 mV

## 2013-10-22 ENCOUNTER — Encounter: Payer: Self-pay | Admitting: *Deleted

## 2013-10-23 ENCOUNTER — Other Ambulatory Visit: Payer: Self-pay | Admitting: Otolaryngology

## 2013-10-23 DIAGNOSIS — H903 Sensorineural hearing loss, bilateral: Secondary | ICD-10-CM

## 2013-10-28 ENCOUNTER — Encounter: Payer: Self-pay | Admitting: Internal Medicine

## 2013-10-30 ENCOUNTER — Other Ambulatory Visit: Payer: Medicare Other

## 2013-11-05 ENCOUNTER — Ambulatory Visit
Admission: RE | Admit: 2013-11-05 | Discharge: 2013-11-05 | Disposition: A | Discharge status: Home / Self Care | Payer: Medicare Other | Source: Ambulatory Visit | Attending: Otolaryngology | Admitting: Otolaryngology

## 2013-11-05 DIAGNOSIS — H905 Unspecified sensorineural hearing loss: Secondary | ICD-10-CM

## 2013-11-05 DIAGNOSIS — H903 Sensorineural hearing loss, bilateral: Secondary | ICD-10-CM

## 2013-11-05 LAB — CREATININE, SERUM: Creat: 0.9 mg/dL (ref 0.50–1.35)

## 2013-11-05 LAB — BUN: BUN: 17 mg/dL (ref 6–23)

## 2013-11-05 MED ORDER — IOHEXOL 300 MG/ML  SOLN
75.0000 mL | Freq: Once | INTRAMUSCULAR | Status: AC | PRN
  Administered 2013-11-05: 75 mL via INTRAVENOUS

## 2013-12-23 LAB — MDC_IDC_ENUM_SESS_TYPE_REMOTE
Battery Voltage: 2.78 V
Brady Statistic RA Percent Paced: 99.6 %
Brady Statistic RV Percent Paced: 100 %
Lead Channel Impedance Value: 430 Ohm
Lead Channel Impedance Value: 758 Ohm
Lead Channel Setting Pacing Amplitude: 2.5 V
Lead Channel Setting Pacing Pulse Width: 0.4 ms
Lead Channel Setting Sensing Sensitivity: 2.8 mV
MDC IDC MSMT BATTERY REMAINING LONGEVITY: 77 mo
MDC IDC MSMT LEADCHNL RA SENSING INTR AMPL: 0.5 mV
MDC IDC MSMT LEADCHNL RV PACING THRESHOLD AMPLITUDE: 0.625 V
MDC IDC MSMT LEADCHNL RV PACING THRESHOLD PULSEWIDTH: 0.4 ms
MDC IDC SET LEADCHNL RA PACING AMPLITUDE: 2 V

## 2013-12-24 ENCOUNTER — Ambulatory Visit (INDEPENDENT_AMBULATORY_CARE_PROVIDER_SITE_OTHER): Payer: Medicare Other | Admitting: *Deleted

## 2013-12-24 DIAGNOSIS — I495 Sick sinus syndrome: Secondary | ICD-10-CM

## 2013-12-29 ENCOUNTER — Encounter: Payer: Self-pay | Admitting: *Deleted

## 2014-01-06 NOTE — Progress Notes (Signed)
PPM remote 

## 2014-01-07 ENCOUNTER — Encounter: Payer: Self-pay | Admitting: *Deleted

## 2014-01-15 ENCOUNTER — Encounter: Payer: Self-pay | Admitting: Internal Medicine

## 2014-01-23 ENCOUNTER — Other Ambulatory Visit: Payer: Self-pay

## 2014-01-23 MED ORDER — AMLODIPINE BESYLATE 10 MG PO TABS: 10.0000 mg | ORAL_TABLET | Freq: Every day | ORAL | Status: DC

## 2014-03-26 ENCOUNTER — Telehealth: Payer: Self-pay | Admitting: Cardiology

## 2014-03-26 ENCOUNTER — Ambulatory Visit (INDEPENDENT_AMBULATORY_CARE_PROVIDER_SITE_OTHER): Payer: Medicare Other | Admitting: *Deleted

## 2014-03-26 DIAGNOSIS — I495 Sick sinus syndrome: Secondary | ICD-10-CM

## 2014-03-26 DIAGNOSIS — I442 Atrioventricular block, complete: Secondary | ICD-10-CM

## 2014-03-26 LAB — MDC_IDC_ENUM_SESS_TYPE_REMOTE
Battery Voltage: 2.78 V
Brady Statistic AP VP Percent: 99.6 %
Brady Statistic AP VS Percent: 0.1 %
Brady Statistic AS VS Percent: 0.1 %
Lead Channel Impedance Value: 418 Ohm
Lead Channel Impedance Value: 736 Ohm
Lead Channel Pacing Threshold Amplitude: 0.5 V
Lead Channel Pacing Threshold Amplitude: 0.625 V
Lead Channel Pacing Threshold Pulse Width: 0.4 ms
Lead Channel Setting Pacing Amplitude: 2 V
Lead Channel Setting Pacing Amplitude: 2.5 V
MDC IDC MSMT BATTERY REMAINING LONGEVITY: 72 mo
MDC IDC MSMT LEADCHNL RV PACING THRESHOLD PULSEWIDTH: 0.4 ms
MDC IDC SET LEADCHNL RV PACING PULSEWIDTH: 0.4 ms
MDC IDC SET LEADCHNL RV SENSING SENSITIVITY: 2.8 mV
MDC IDC STAT BRADY AS VP PERCENT: 0.4 %

## 2014-03-26 NOTE — Telephone Encounter (Signed)
LMOVM reminding pt to send remote transmission.   

## 2014-03-27 NOTE — Progress Notes (Signed)
Remote pacemaker transmission.   

## 2014-04-14 ENCOUNTER — Encounter: Payer: Self-pay | Admitting: Cardiology

## 2014-04-16 ENCOUNTER — Encounter: Payer: Self-pay | Admitting: Cardiology

## 2014-04-22 ENCOUNTER — Encounter: Payer: Self-pay | Admitting: Internal Medicine

## 2014-07-23 ENCOUNTER — Encounter: Payer: Self-pay | Admitting: Internal Medicine

## 2014-07-23 ENCOUNTER — Ambulatory Visit (INDEPENDENT_AMBULATORY_CARE_PROVIDER_SITE_OTHER): Payer: Medicare Other | Admitting: Internal Medicine

## 2014-07-23 VITALS — BP 134/76 | HR 67 | Ht 72.0 in | Wt 214.2 lb

## 2014-07-23 DIAGNOSIS — I442 Atrioventricular block, complete: Secondary | ICD-10-CM

## 2014-07-23 DIAGNOSIS — Z950 Presence of cardiac pacemaker: Secondary | ICD-10-CM

## 2014-07-23 LAB — MDC_IDC_ENUM_SESS_TYPE_INCLINIC
Battery Remaining Longevity: 71 mo
Brady Statistic AP VP Percent: 99 %
Brady Statistic AP VS Percent: 0 %
Brady Statistic AS VP Percent: 1 %
Date Time Interrogation Session: 20151015191101
Lead Channel Impedance Value: 435 Ohm
Lead Channel Impedance Value: 732 Ohm
Lead Channel Pacing Threshold Amplitude: 0.5 V
Lead Channel Pacing Threshold Amplitude: 0.75 V
Lead Channel Setting Pacing Amplitude: 2.5 V
Lead Channel Setting Sensing Sensitivity: 2.8 mV
MDC IDC MSMT BATTERY IMPEDANCE: 616 Ohm
MDC IDC MSMT BATTERY VOLTAGE: 2.78 V
MDC IDC MSMT LEADCHNL RA PACING THRESHOLD PULSEWIDTH: 0.4 ms
MDC IDC MSMT LEADCHNL RV PACING THRESHOLD PULSEWIDTH: 0.4 ms
MDC IDC SET LEADCHNL RA PACING AMPLITUDE: 2 V
MDC IDC SET LEADCHNL RV PACING PULSEWIDTH: 0.4 ms
MDC IDC STAT BRADY AS VS PERCENT: 0 %

## 2014-07-23 NOTE — Progress Notes (Signed)
  HPI  Luke Schmidt is a 73 y.o. male Seen in followup for pacemaker implantation years ago for complete heart block. He  underwent generator replacement 2010. The patient denies chest pain, shortness of breath, nocturnal dyspnea, orthopnea or peripheral edema. There have been no palpitations, lightheadedness or syncope.    Past Medical History  Diagnosis Date  . Complete heart block   . Sinus node dysfunction   . HTN (hypertension)   . Hypertriglyceridemia   . Pacemaker     Medtronic adapta  L; generator replacement 2010    Past Surgical History  Procedure Laterality Date  . Pacemaker insertion      Medtronic     Current Outpatient Prescriptions  Medication Sig Dispense Refill  . amLODipine (NORVASC) 10 MG tablet Take 1 tablet (10 mg total) by mouth daily.  30 tablet  6  . aspirin 81 MG EC tablet Take 162 mg by mouth daily.        Marland Kitchen atorvastatin (LIPITOR) 10 MG tablet       . KLOR-CON 10 10 MEQ tablet       . losartan-hydrochlorothiazide (HYZAAR) 50-12.5 MG per tablet Take 1 tablet by mouth daily.         No current facility-administered medications for this visit.    Allergies  Allergen Reactions  . Penicillins     Hives     Review of Systems negative except from HPI and PMH  Physical Exam BP 134/76  Pulse 67  Ht 6' (1.829 m)  Wt 214 lb 3.2 oz (97.16 kg)  BMI 29.04 kg/m2  Well developed and nourished in no acute distress HENT normal Neck supple with JVP-flat Clear Regular rate and rhythm, no murmurs or gallops Abd-soft with active BS No Clubbing cyanosis edema Skin-warm and dry A & Oriented  Grossly normal sensory and motor function  ecg AV  pacing  Assessment and  Plan  1 complete heart block  2 sinus node dysfunction  3 pacemaker-Medtronic  4-hypertension  Overall he is doing well. Device interrogation suggests some degree of chronotropic incompetence; however, he is not inclined at this point to have his device reprogrammed. He has the  impression that if we increase the heart rate of his pacemaker, the longevity of his heart was of her period

## 2014-07-23 NOTE — Patient Instructions (Signed)
Your physician recommends that you continue on your current medications as directed. Please refer to the Current Medication list given to you today.  Remote monitoring is used to monitor your Pacemaker of ICD from home. This monitoring reduces the number of office visits required to check your device to one time per year. It allows [place] to keep an eye on the functioning of your device to ensure it is working properly. You are scheduled for a device check from home on 10/22/14. You may send your transmission at any time that day. If you have a wireless device, the transmission will be sent automatically. After your physician reviews your transmission, you will receive a postcard with your next transmission date.  Your physician wants you to follow-up in: 1 year with Dr. Graciela Husbands.  You will receive a reminder letter in the mail two months in advance. If you don't receive a letter, please call our office to schedule the follow-up appointment.

## 2014-08-27 ENCOUNTER — Other Ambulatory Visit: Payer: Self-pay | Admitting: *Deleted

## 2014-08-27 MED ORDER — AMLODIPINE BESYLATE 10 MG PO TABS: 10.0000 mg | ORAL_TABLET | Freq: Every day | ORAL | Status: AC

## 2014-10-22 ENCOUNTER — Encounter: Payer: Medicare Other | Admitting: *Deleted

## 2014-10-22 ENCOUNTER — Telehealth: Payer: Self-pay | Admitting: Cardiology

## 2014-10-22 NOTE — Telephone Encounter (Signed)
Attempted to confirm remote transmission with pt. No answer and was unable to leave a message.   

## 2014-10-23 ENCOUNTER — Encounter: Payer: Self-pay | Admitting: Cardiology

## 2014-10-29 ENCOUNTER — Ambulatory Visit (INDEPENDENT_AMBULATORY_CARE_PROVIDER_SITE_OTHER): Payer: Medicare Other | Admitting: *Deleted

## 2014-10-29 DIAGNOSIS — I442 Atrioventricular block, complete: Secondary | ICD-10-CM

## 2014-10-29 NOTE — Progress Notes (Signed)
Remote pacemaker transmission.   

## 2014-11-02 LAB — MDC_IDC_ENUM_SESS_TYPE_REMOTE
Battery Impedance: 691 Ohm
Battery Remaining Longevity: 68 mo
Battery Voltage: 2.78 V
Brady Statistic AP VP Percent: 100 %
Brady Statistic AP VS Percent: 0 %
Brady Statistic AS VP Percent: 0 %
Brady Statistic AS VS Percent: 0 %
Date Time Interrogation Session: 20160121190308
Lead Channel Impedance Value: 441 Ohm
Lead Channel Pacing Threshold Amplitude: 0.5 V
Lead Channel Pacing Threshold Amplitude: 0.625 V
Lead Channel Pacing Threshold Pulse Width: 0.4 ms
Lead Channel Setting Pacing Amplitude: 2 V
Lead Channel Setting Pacing Amplitude: 2.5 V
Lead Channel Setting Pacing Pulse Width: 0.4 ms
MDC IDC MSMT LEADCHNL RV IMPEDANCE VALUE: 746 Ohm
MDC IDC MSMT LEADCHNL RV PACING THRESHOLD PULSEWIDTH: 0.4 ms
MDC IDC SET LEADCHNL RV SENSING SENSITIVITY: 2.8 mV

## 2014-11-16 ENCOUNTER — Encounter: Payer: Self-pay | Admitting: Cardiology

## 2014-11-19 ENCOUNTER — Encounter: Payer: Self-pay | Admitting: Internal Medicine

## 2014-11-30 ENCOUNTER — Encounter: Payer: Self-pay | Admitting: Cardiology

## 2015-01-27 ENCOUNTER — Emergency Department (HOSPITAL_COMMUNITY): Payer: Medicare Other

## 2015-01-27 ENCOUNTER — Encounter (HOSPITAL_COMMUNITY): Payer: Self-pay

## 2015-01-27 ENCOUNTER — Emergency Department (HOSPITAL_COMMUNITY)
Admission: EM | Admit: 2015-01-27 | Discharge: 2015-01-28 | Disposition: A | Discharge status: Home / Self Care | Payer: Medicare Other | Attending: Emergency Medicine | Admitting: Emergency Medicine

## 2015-01-27 DIAGNOSIS — Y998 Other external cause status: Secondary | ICD-10-CM | POA: Insufficient documentation

## 2015-01-27 DIAGNOSIS — Z87442 Personal history of urinary calculi: Secondary | ICD-10-CM | POA: Diagnosis not present

## 2015-01-27 DIAGNOSIS — Y9289 Other specified places as the place of occurrence of the external cause: Secondary | ICD-10-CM | POA: Insufficient documentation

## 2015-01-27 DIAGNOSIS — Z8639 Personal history of other endocrine, nutritional and metabolic disease: Secondary | ICD-10-CM | POA: Insufficient documentation

## 2015-01-27 DIAGNOSIS — I10 Essential (primary) hypertension: Secondary | ICD-10-CM | POA: Insufficient documentation

## 2015-01-27 DIAGNOSIS — Z950 Presence of cardiac pacemaker: Secondary | ICD-10-CM | POA: Insufficient documentation

## 2015-01-27 DIAGNOSIS — Z79899 Other long term (current) drug therapy: Secondary | ICD-10-CM | POA: Diagnosis not present

## 2015-01-27 DIAGNOSIS — R1030 Lower abdominal pain, unspecified: Secondary | ICD-10-CM | POA: Diagnosis not present

## 2015-01-27 DIAGNOSIS — S39012A Strain of muscle, fascia and tendon of lower back, initial encounter: Secondary | ICD-10-CM | POA: Insufficient documentation

## 2015-01-27 DIAGNOSIS — X58XXXA Exposure to other specified factors, initial encounter: Secondary | ICD-10-CM | POA: Insufficient documentation

## 2015-01-27 DIAGNOSIS — Z880 Allergy status to penicillin: Secondary | ICD-10-CM | POA: Diagnosis not present

## 2015-01-27 DIAGNOSIS — Y9389 Activity, other specified: Secondary | ICD-10-CM | POA: Insufficient documentation

## 2015-01-27 DIAGNOSIS — Z7982 Long term (current) use of aspirin: Secondary | ICD-10-CM | POA: Diagnosis not present

## 2015-01-27 DIAGNOSIS — R109 Unspecified abdominal pain: Secondary | ICD-10-CM

## 2015-01-27 LAB — CBC
HCT: 46.2 % (ref 39.0–52.0)
Hemoglobin: 16.2 g/dL (ref 13.0–17.0)
MCH: 30 pg (ref 26.0–34.0)
MCHC: 35.1 g/dL (ref 30.0–36.0)
MCV: 85.6 fL (ref 78.0–100.0)
PLATELETS: 248 10*3/uL (ref 150–400)
RBC: 5.4 MIL/uL (ref 4.22–5.81)
RDW: 13.3 % (ref 11.5–15.5)
WBC: 8.3 10*3/uL (ref 4.0–10.5)

## 2015-01-27 LAB — BASIC METABOLIC PANEL
Anion gap: 9 (ref 5–15)
BUN: 13 mg/dL (ref 6–23)
CO2: 28 mmol/L (ref 19–32)
CREATININE: 0.92 mg/dL (ref 0.50–1.35)
Calcium: 9.6 mg/dL (ref 8.4–10.5)
Chloride: 101 mmol/L (ref 96–112)
GFR calc Af Amer: >90 mL/min (ref >90)
GFR calc non Af Amer: 81 mL/min — ABNORMAL LOW (ref >90)
GLUCOSE: 130 mg/dL — AB (ref 70–99)
Potassium: 3.6 mmol/L (ref 3.5–5.1)
SODIUM: 138 mmol/L (ref 135–145)

## 2015-01-27 LAB — URINALYSIS, ROUTINE W REFLEX MICROSCOPIC
Bilirubin Urine: NEGATIVE
Glucose, UA: NEGATIVE mg/dL
Hgb urine dipstick: NEGATIVE
Ketones, ur: NEGATIVE mg/dL
LEUKOCYTES UA: NEGATIVE
Nitrite: NEGATIVE
PROTEIN: 30 mg/dL — AB
Specific Gravity, Urine: 1.021 (ref 1.005–1.030)
Urobilinogen, UA: 1 mg/dL (ref 0.0–1.0)
pH: 7.5 (ref 5.0–8.0)

## 2015-01-27 LAB — URINE MICROSCOPIC-ADD ON

## 2015-01-27 MED ORDER — ONDANSETRON HCL 4 MG/2ML IJ SOLN
4.0000 mg | Freq: Once | INTRAMUSCULAR | Status: AC
  Administered 2015-01-27: 4 mg via INTRAVENOUS
  Filled 2015-01-27: qty 2

## 2015-01-27 MED ORDER — KETOROLAC TROMETHAMINE 30 MG/ML IJ SOLN
30.0000 mg | Freq: Once | INTRAMUSCULAR | Status: AC
  Administered 2015-01-27: 30 mg via INTRAVENOUS
  Filled 2015-01-27: qty 1

## 2015-01-27 MED ORDER — MORPHINE SULFATE 4 MG/ML IJ SOLN
4.0000 mg | Freq: Once | INTRAMUSCULAR | Status: AC
  Administered 2015-01-27: 4 mg via INTRAVENOUS
  Filled 2015-01-27: qty 1

## 2015-01-27 MED ORDER — HYDROCODONE-ACETAMINOPHEN 5-325 MG PO TABS: 1.0000 | ORAL_TABLET | Freq: Four times a day (QID) | ORAL | Status: DC | PRN

## 2015-01-27 NOTE — ED Notes (Signed)
Pt transported to CT ?

## 2015-01-27 NOTE — ED Notes (Signed)
Per pt, flank pain starting last Tuesday.  Pt felt it was a strain from playing golf.  Pain has continued.  Left flank.  No change in urination.  Pt with history of kidney stones.

## 2015-01-27 NOTE — Discharge Instructions (Signed)

## 2015-01-27 NOTE — ED Provider Notes (Signed)
CSN: [id number]     Arrival date & time 01/27/15  1901 History   First MD Initiated Contact with Patient 01/27/15 2224     Chief Complaint  Patient presents with  . Flank Pain     (Consider location/radiation/quality/duration/timing/severity/associated sxs/prior Treatment) HPI Comments: Patient presents to the emergency department with chief complaint of left flank pain. States pain started last Tuesday. He felt like he was a strain from playing golf. States that the pain has persisted and has worsened. States the pain became severe today. States that it felt similar to an episode when he had a prior kidney stone. He denies any dysuria or hematuria. States that he has not taken anything for the pain. The pain is better when he stands. Has follow-up with orthopedics tomorrow.  The history is provided by the patient. No language interpreter was used.    Past Medical History  Diagnosis Date  . Complete heart block   . Sinus node dysfunction   . HTN (hypertension)   . Hypertriglyceridemia   . Pacemaker     Medtronic adapta  L; generator replacement 2010   Past Surgical History  Procedure Laterality Date  . Pacemaker insertion      Medtronic    History reviewed. No pertinent family history. History  Substance Use Topics  . Smoking status: Never Smoker   . Smokeless tobacco: Never Used  . Alcohol Use: Yes     Comment: 3 x week    Review of Systems  Constitutional: Negative for fever and chills.  Respiratory: Negative for shortness of breath.   Cardiovascular: Negative for chest pain.  Gastrointestinal: Negative for nausea, vomiting, diarrhea and constipation.       No bowel incontinence  Genitourinary: Positive for flank pain. Negative for dysuria.       No urinary incontinence  Musculoskeletal: Positive for myalgias, back pain and arthralgias.  Neurological:       No saddle anesthesia  All other systems reviewed and are negative.     Allergies  Penicillins  Home  Medications   Prior to Admission medications   Medication Sig Start Date End Date Taking? Authorizing Provider  amLODipine (NORVASC) 10 MG tablet Take 1 tablet (10 mg total) by mouth daily. 08/27/14  Yes Duke Salvia, MD  aspirin 81 MG EC tablet Take 162 mg by mouth daily.     Yes Historical Provider, MD  atorvastatin (LIPITOR) 10 MG tablet Take 10 mg by mouth daily.  05/28/13  Yes Historical Provider, MD  cholecalciferol (VITAMIN D) 1000 UNITS tablet Take 1,000 Units by mouth daily.   Yes Historical Provider, MD  ibuprofen (ADVIL,MOTRIN) 200 MG tablet Take 400 mg by mouth every 6 (six) hours as needed for moderate pain.   Yes Historical Provider, MD  KLOR-CON 10 10 MEQ tablet Take 10 mEq by mouth daily.  05/28/13  Yes Historical Provider, MD  losartan-hydrochlorothiazide (HYZAAR) 50-12.5 MG per tablet Take 1 tablet by mouth daily.     Yes Historical Provider, MD   BP 155/81 mmHg  Pulse 69  Temp(Src) 97.9 F (36.6 C) (Oral)  Resp 18  SpO2 99% Physical Exam  Constitutional: He is oriented to person, place, and time. He appears well-developed and well-nourished.  HENT:  Head: Normocephalic and atraumatic.  Eyes: Conjunctivae and EOM are normal. Pupils are equal, round, and reactive to light. Right eye exhibits no discharge. Left eye exhibits no discharge. No scleral icterus.  Neck: Normal range of motion. Neck supple. No JVD present.  Cardiovascular: Normal rate, regular rhythm and normal heart sounds.  Exam reveals no gallop and no friction rub.   No murmur heard. Pulmonary/Chest: Effort normal and breath sounds normal. No respiratory distress. He has no wheezes. He has no rales. He exhibits no tenderness.  Abdominal: Soft. He exhibits no distension and no mass. There is no tenderness. There is no rebound and no guarding.  Musculoskeletal: Normal range of motion. He exhibits no edema or tenderness.  Neurological: He is alert and oriented to person, place, and time.  Skin: Skin is warm  and dry.  Psychiatric: He has a normal mood and affect. His behavior is normal. Judgment and thought content normal.  Nursing note and vitals reviewed.   ED Course  Procedures (including critical care time) Results for orders placed or performed during the hospital encounter of 01/27/15  Urinalysis, Routine w reflex microscopic  Result Value Ref Range   Color, Urine YELLOW YELLOW   APPearance CLOUDY (A) CLEAR   Specific Gravity, Urine 1.021 1.005 - 1.030   pH 7.5 5.0 - 8.0   Glucose, UA NEGATIVE NEGATIVE mg/dL   Hgb urine dipstick NEGATIVE NEGATIVE   Bilirubin Urine NEGATIVE NEGATIVE   Ketones, ur NEGATIVE NEGATIVE mg/dL   Protein, ur 30 (A) NEGATIVE mg/dL   Urobilinogen, UA 1.0 0.0 - 1.0 mg/dL   Nitrite NEGATIVE NEGATIVE   Leukocytes, UA NEGATIVE NEGATIVE  Basic metabolic panel  Result Value Ref Range   Sodium 138 135 - 145 mmol/L   Potassium 3.6 3.5 - 5.1 mmol/L   Chloride 101 96 - 112 mmol/L   CO2 28 19 - 32 mmol/L   Glucose, Bld 130 (H) 70 - 99 mg/dL   BUN 13 6 - 23 mg/dL   Creatinine, Ser 1.61 0.50 - 1.35 mg/dL   Calcium 9.6 8.4 - 09.6 mg/dL   GFR calc non Af Amer 81 (L) >90 mL/min   GFR calc Af Amer >90 >90 mL/min   Anion gap 9 5 - 15  CBC  Result Value Ref Range   WBC 8.3 4.0 - 10.5 K/uL   RBC 5.40 4.22 - 5.81 MIL/uL   Hemoglobin 16.2 13.0 - 17.0 g/dL   HCT 04.5 40.9 - 81.1 %   MCV 85.6 78.0 - 100.0 fL   MCH 30.0 26.0 - 34.0 pg   MCHC 35.1 30.0 - 36.0 g/dL   RDW 91.4 78.2 - 95.6 %   Platelets 248 150 - 400 K/uL  Urine microscopic-add on  Result Value Ref Range   RBC / HPF 0-2 <3 RBC/hpf   Bacteria, UA FEW (A) RARE   Sperm, UA PRESENT    Ct Abdomen Pelvis Wo Contrast  01/27/2015   CLINICAL DATA:  Left flank pain, onset last week but determining worse today.  EXAM: CT ABDOMEN AND PELVIS WITHOUT CONTRAST  TECHNIQUE: Multidetector CT imaging of the abdomen and pelvis was performed following the standard protocol without IV contrast.  COMPARISON:  None.   FINDINGS: BODY WALL: Fatty enlargement of the right inguinal canal.  LOWER CHEST: Coronary atherosclerosis. Right heart pacer lead noted.  ABDOMEN/PELVIS:  Liver: 2 sub cm low densities in the left liver are likely cysts.  Biliary: Cholelithiasis without evidence of biliary obstruction or inflammation.  Pancreas: Unremarkable.  Spleen: Unremarkable.  Adrenals: Unremarkable.  Kidneys and ureters: Bilateral renal cortical cysts, up to 7 cm in the lower pole right kidney. There are left renal sinus cysts. No hydronephrosis or urolithiasis  Bladder: Unremarkable.  Reproductive: Mild, symmetric prostate enlargement,  deforming the bladder base.  Bowel: No obstruction. Few distal colonic diverticula. No pericecal inflammation.  Retroperitoneum: No mass or adenopathy.  Peritoneum: No ascites or pneumoperitoneum.  Vascular: No acute abnormality.  OSSEOUS: No acute abnormalities.  IMPRESSION: 1. No explanation for acute abdominal pain. No hydronephrosis or urolithiasis. 2. Cholelithiasis and other incidental findings are noted above.   Electronically Signed   By: Marnee Spring M.D.   On: 01/27/2015 23:21      EKG Interpretation None      MDM   Final diagnoses:  Flank pain  Lumbar strain, initial encounter    Patient with left flank pain and history of KS.  Toradol ordered per nursing protocols prior to being seen by me.  States he had modest improvement.  I will give some morphine.  UA is reassuring.  Will check CT for stone.  If negative, patient can likely follow-up with orthopedics.  CT is negative. Patient seen by and discussed with Dr. Criss Alvine, who agrees with plan for orthopedic follow-up tomorrow. Patient is stable and ready for discharge. Will give some Norco.    Roxy Horseman, PA-C 01/27/15 2348  Pricilla Loveless, MD 01/30/15 [phone]

## 2015-01-28 ENCOUNTER — Ambulatory Visit (INDEPENDENT_AMBULATORY_CARE_PROVIDER_SITE_OTHER): Payer: Medicare Other | Admitting: *Deleted

## 2015-01-28 ENCOUNTER — Encounter: Payer: Self-pay | Admitting: Internal Medicine

## 2015-01-28 ENCOUNTER — Telehealth: Payer: Self-pay | Admitting: Cardiology

## 2015-01-28 DIAGNOSIS — I442 Atrioventricular block, complete: Secondary | ICD-10-CM

## 2015-01-28 NOTE — Telephone Encounter (Signed)
LMOVM reminding pt to send remote transmission.   

## 2015-01-29 NOTE — Progress Notes (Signed)
Remote pacemaker transmission.   

## 2015-02-04 ENCOUNTER — Other Ambulatory Visit: Payer: Self-pay | Admitting: Sports Medicine

## 2015-02-04 DIAGNOSIS — M5442 Lumbago with sciatica, left side: Secondary | ICD-10-CM

## 2015-02-05 ENCOUNTER — Ambulatory Visit
Admission: RE | Admit: 2015-02-05 | Discharge: 2015-02-05 | Disposition: A | Discharge status: Home / Self Care | Payer: Medicare Other | Source: Ambulatory Visit | Attending: Sports Medicine | Admitting: Sports Medicine

## 2015-02-05 DIAGNOSIS — M5442 Lumbago with sciatica, left side: Secondary | ICD-10-CM

## 2015-02-12 LAB — CUP PACEART REMOTE DEVICE CHECK
Battery Impedance: 741 Ohm
Battery Remaining Longevity: 65 mo
Brady Statistic AP VS Percent: 0 %
Brady Statistic AS VP Percent: 0 %
Lead Channel Impedance Value: 435 Ohm
Lead Channel Impedance Value: 704 Ohm
Lead Channel Pacing Threshold Amplitude: 0.5 V
Lead Channel Pacing Threshold Amplitude: 0.5 V
Lead Channel Pacing Threshold Pulse Width: 0.4 ms
Lead Channel Setting Pacing Pulse Width: 0.4 ms
Lead Channel Setting Sensing Sensitivity: 2.8 mV
MDC IDC MSMT BATTERY VOLTAGE: 2.78 V
MDC IDC MSMT LEADCHNL RA PACING THRESHOLD PULSEWIDTH: 0.4 ms
MDC IDC SESS DTM: 20160421230757
MDC IDC SET LEADCHNL RA PACING AMPLITUDE: 2 V
MDC IDC SET LEADCHNL RV PACING AMPLITUDE: 2.5 V
MDC IDC STAT BRADY AP VP PERCENT: 100 %
MDC IDC STAT BRADY AS VS PERCENT: 0 %

## 2015-03-01 ENCOUNTER — Encounter: Payer: Self-pay | Admitting: Cardiology

## 2015-05-03 ENCOUNTER — Ambulatory Visit (INDEPENDENT_AMBULATORY_CARE_PROVIDER_SITE_OTHER): Payer: Medicare Other | Admitting: *Deleted

## 2015-05-03 DIAGNOSIS — I442 Atrioventricular block, complete: Secondary | ICD-10-CM

## 2015-05-03 DIAGNOSIS — Z950 Presence of cardiac pacemaker: Secondary | ICD-10-CM | POA: Diagnosis not present

## 2015-05-03 DIAGNOSIS — I495 Sick sinus syndrome: Secondary | ICD-10-CM

## 2015-05-11 LAB — CUP PACEART REMOTE DEVICE CHECK
Battery Impedance: 867 Ohm
Battery Voltage: 2.77 V
Brady Statistic AP VP Percent: 100 %
Brady Statistic AS VP Percent: 0 %
Brady Statistic AS VS Percent: 0 %
Date Time Interrogation Session: 20160725161519
Lead Channel Impedance Value: 441 Ohm
Lead Channel Setting Pacing Amplitude: 2.5 V
Lead Channel Setting Sensing Sensitivity: 2.8 mV
MDC IDC MSMT BATTERY REMAINING LONGEVITY: 60 mo
MDC IDC MSMT LEADCHNL RV IMPEDANCE VALUE: 757 Ohm
MDC IDC SET LEADCHNL RA PACING AMPLITUDE: 2 V
MDC IDC SET LEADCHNL RV PACING PULSEWIDTH: 0.4 ms
MDC IDC STAT BRADY AP VS PERCENT: 0 %

## 2015-05-11 NOTE — Progress Notes (Signed)
Pacemaker remote check. Device function reviewed. Impedance, sensing, auto capture thresholds consistent with previous measurements. Histograms appropriate for patient and level of activity. All other diagnostic data reviewed and is appropriate and stable for patient. Real time/magnet EGM shows appropriate capture. No  ventricular high rate episodes. 1 mode switch episode, less than 30 seconds. Estimated longevity 5 years.  Due SK 07/2015

## 2015-05-25 ENCOUNTER — Encounter: Payer: Self-pay | Admitting: Cardiology

## 2015-06-03 ENCOUNTER — Encounter: Payer: Self-pay | Admitting: Internal Medicine

## 2015-08-03 ENCOUNTER — Encounter: Payer: Self-pay | Admitting: Internal Medicine

## 2015-08-03 ENCOUNTER — Ambulatory Visit (INDEPENDENT_AMBULATORY_CARE_PROVIDER_SITE_OTHER): Payer: Medicare Other | Admitting: Internal Medicine

## 2015-08-03 VITALS — BP 130/78 | HR 66 | Ht 73.0 in | Wt 208.4 lb

## 2015-08-03 DIAGNOSIS — I495 Sick sinus syndrome: Secondary | ICD-10-CM | POA: Diagnosis not present

## 2015-08-03 DIAGNOSIS — Z950 Presence of cardiac pacemaker: Secondary | ICD-10-CM

## 2015-08-03 DIAGNOSIS — I442 Atrioventricular block, complete: Secondary | ICD-10-CM | POA: Diagnosis not present

## 2015-08-03 LAB — CUP PACEART INCLINIC DEVICE CHECK
Battery Impedance: 944 Ohm
Battery Remaining Longevity: 57 mo
Battery Voltage: 2.77 V
Brady Statistic AP VS Percent: 0 %
Brady Statistic AS VP Percent: 0 %
Date Time Interrogation Session: 20161025163234
Implantable Lead Implant Date: 19980414
Implantable Lead Location: 753860
Lead Channel Impedance Value: 735 Ohm
Lead Channel Pacing Threshold Amplitude: 0.5 V
Lead Channel Pacing Threshold Amplitude: 0.5 V
Lead Channel Pacing Threshold Amplitude: 0.5 V
Lead Channel Pacing Threshold Amplitude: 0.625 V
Lead Channel Pacing Threshold Pulse Width: 0.4 ms
Lead Channel Pacing Threshold Pulse Width: 0.4 ms
Lead Channel Setting Pacing Pulse Width: 0.4 ms
Lead Channel Setting Sensing Sensitivity: 2.8 mV
MDC IDC LEAD IMPLANT DT: 19980414
MDC IDC LEAD LOCATION: 753859
MDC IDC MSMT LEADCHNL RA IMPEDANCE VALUE: 447 Ohm
MDC IDC MSMT LEADCHNL RA PACING THRESHOLD PULSEWIDTH: 0.4 ms
MDC IDC MSMT LEADCHNL RV PACING THRESHOLD PULSEWIDTH: 0.4 ms
MDC IDC SET LEADCHNL RA PACING AMPLITUDE: 2 V
MDC IDC SET LEADCHNL RV PACING AMPLITUDE: 2.5 V
MDC IDC STAT BRADY AP VP PERCENT: 100 %
MDC IDC STAT BRADY AS VS PERCENT: 0 %

## 2015-08-03 NOTE — Patient Instructions (Signed)
Medication Instructions: - no changes  Labwork: - none  Procedures/Testing: - none  Follow-Up: - Remote monitoring is used to monitor your Pacemaker of ICD from home. This monitoring reduces the number of office visits required to check your device to one time per year. It allows [place] to keep an eye on the functioning of your device to ensure it is working properly. You are scheduled for a device check from home on 11/02/15. You may send your transmission at any time that day. If you have a wireless device, the transmission will be sent automatically. After your physician reviews your transmission, you will receive a postcard with your next transmission date.  - Your physician wants you to follow-up in: 1 year with Dr. Graciela Husbands. You will receive a reminder letter in the mail two months in advance. If you don't receive a letter, please call our office to schedule the follow-up appointment.  Any Additional Special Instructions Will Be Listed Below (If Applicable).

## 2015-08-03 NOTE — Progress Notes (Signed)
  HPI  Luke Schmidt is a 74 y.o. male Seen in followup for pacemaker implantation years ago for complete heart block. He  underwent generator replacement 2010.   The patient denies chest pain, shortness of breath, nocturnal dyspnea, orthopnea or peripheral edema. There have been no palpitations, lightheadedness or syncope.   He is walking about 10,000 steps a day.  He is tolerating his medications without difficulty.  He is having no bleeding.   Past Medical History  Diagnosis Date  . Complete heart block (HCC)   . Sinus node dysfunction (HCC)   . HTN (hypertension)   . Hypertriglyceridemia   . Pacemaker     Medtronic adapta  L; generator replacement 2010    Past Surgical History  Procedure Laterality Date  . Pacemaker insertion      Medtronic     Current Outpatient Prescriptions  Medication Sig Dispense Refill  . amLODipine (NORVASC) 10 MG tablet Take 1 tablet (10 mg total) by mouth daily. 30 tablet 11  . aspirin 81 MG EC tablet Take 162 mg by mouth daily.      Marland Kitchen atorvastatin (LIPITOR) 10 MG tablet Take 10 mg by mouth daily.     . cholecalciferol (VITAMIN D) 1000 UNITS tablet Take 1,000 Units by mouth daily.    Marland Kitchen KLOR-CON 10 10 MEQ tablet Take 10 mEq by mouth daily.     Marland Kitchen losartan-hydrochlorothiazide (HYZAAR) 50-12.5 MG per tablet Take 1 tablet by mouth daily.       No current facility-administered medications for this visit.    Allergies  Allergen Reactions  . Penicillins     Hives     Review of Systems negative except from HPI and PMH  Physical Exam BP 130/78 mmHg  Pulse 66  Ht 6\' 1"  (1.854 m)  Wt 208 lb 6.4 oz (94.53 kg)  BMI 27.50 kg/m2  Well developed and nourished in no acute distress HENT normal Neck supple with JVP-flat Clear Regular rate and rhythm, no murmurs or gallops Abd-soft with active BS No Clubbing cyanosis edema Skin-warm and dry A & Oriented  Grossly normal sensory and motor function  ecg AV  pacing  Assessment and   Plan  1 complete heart block  2 sinus node dysfunction  3 pacemaker-Medtronic  4-hypertension  The patient is doing very well. 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  We will leave things as they are so as not to "not fix something that it broke"  Blood work is scheduled to be checked by Dr. Valentina Lucks in 2 months time

## 2015-11-02 ENCOUNTER — Ambulatory Visit (INDEPENDENT_AMBULATORY_CARE_PROVIDER_SITE_OTHER): Payer: Medicare Other | Admitting: *Deleted

## 2015-11-02 ENCOUNTER — Telehealth: Payer: Self-pay | Admitting: Cardiology

## 2015-11-02 DIAGNOSIS — I495 Sick sinus syndrome: Secondary | ICD-10-CM

## 2015-11-02 DIAGNOSIS — I442 Atrioventricular block, complete: Secondary | ICD-10-CM | POA: Diagnosis not present

## 2015-11-02 NOTE — Telephone Encounter (Signed)
LMOVM reminding pt to send remote transmission.   

## 2015-11-03 NOTE — Progress Notes (Signed)
Remote pacemaker transmission.   

## 2015-11-05 LAB — CUP PACEART REMOTE DEVICE CHECK
Battery Impedance: 1048 Ohm
Battery Remaining Longevity: 53 mo
Battery Voltage: 2.77 V
Brady Statistic AP VP Percent: 100 %
Brady Statistic AS VP Percent: 0 %
Brady Statistic AS VS Percent: 0 %
Date Time Interrogation Session: 20170124210615
Implantable Lead Implant Date: 19980414
Implantable Lead Implant Date: 19980414
Lead Channel Impedance Value: 441 Ohm
Lead Channel Impedance Value: 750 Ohm
Lead Channel Pacing Threshold Amplitude: 0.5 V
Lead Channel Pacing Threshold Amplitude: 0.625 V
Lead Channel Setting Pacing Amplitude: 2 V
Lead Channel Setting Pacing Amplitude: 2.5 V
Lead Channel Setting Pacing Pulse Width: 0.4 ms
MDC IDC LEAD LOCATION: 753859
MDC IDC LEAD LOCATION: 753860
MDC IDC MSMT LEADCHNL RA PACING THRESHOLD PULSEWIDTH: 0.4 ms
MDC IDC MSMT LEADCHNL RV PACING THRESHOLD PULSEWIDTH: 0.4 ms
MDC IDC SET LEADCHNL RV SENSING SENSITIVITY: 2.8 mV
MDC IDC STAT BRADY AP VS PERCENT: 0 %

## 2015-11-10 ENCOUNTER — Encounter: Payer: Self-pay | Admitting: Cardiology

## 2015-12-23 IMAGING — CT CT L SPINE W/O CM
4 of 10 series · 14 of 34 positions shown, 15 images · non-contrast
Comparison: None.

CLINICAL DATA: Acute low back pain on the left side radiating down
to the left leg for several weeks. Numbness and tingling present in
the left foot.

EXAM:
CT LUMBAR SPINE WITHOUT CONTRAST
TECHNIQUE: Multidetector CT imaging of the lumbar spine was performed without
intravenous contrast administration. Multiplanar CT image
reconstructions were also generated.

[Series 4: l spine bone · axial · 0.32mm/px · z∈[-32,+54]mm · 2 of 103 slices shown]
[im 35/103  bone]
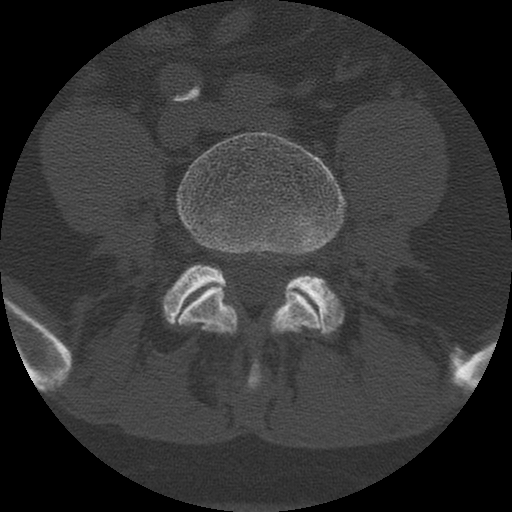
[im 69/103  bone]
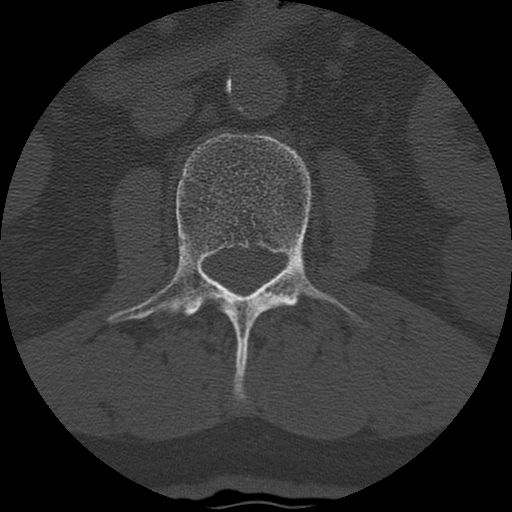

[Series 5: l spine detail · axial · 0.32mm/px · z∈[-54,+74]mm · 3 of 103 slices shown, 4 images]
[im 26/103  soft-tissue]
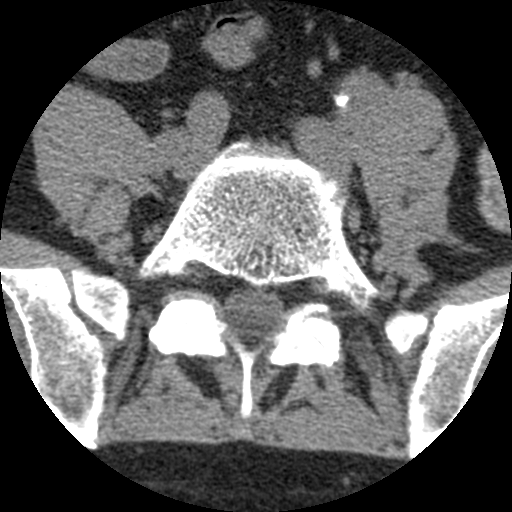
[im 26/103  bone]
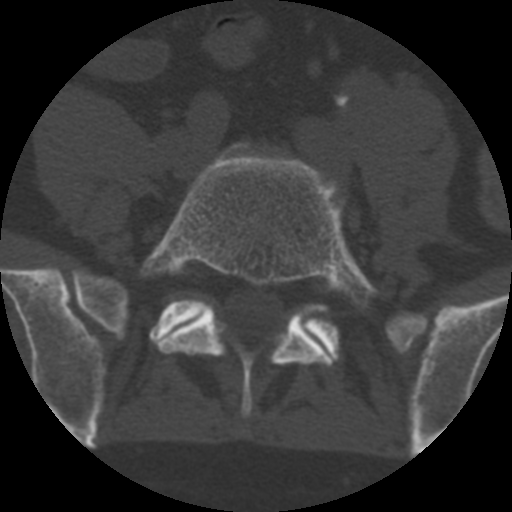
[im 52/103  bone]
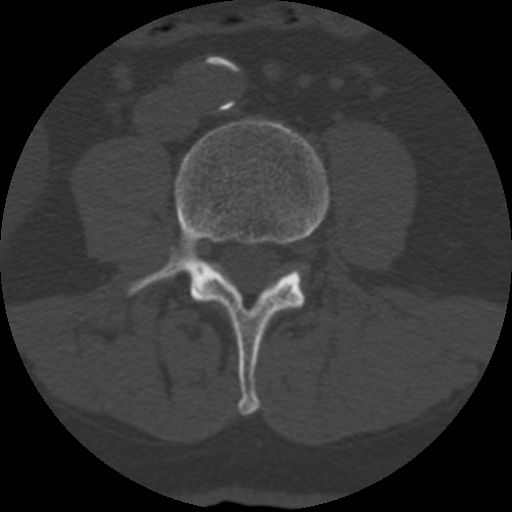
[im 77/103  bone]
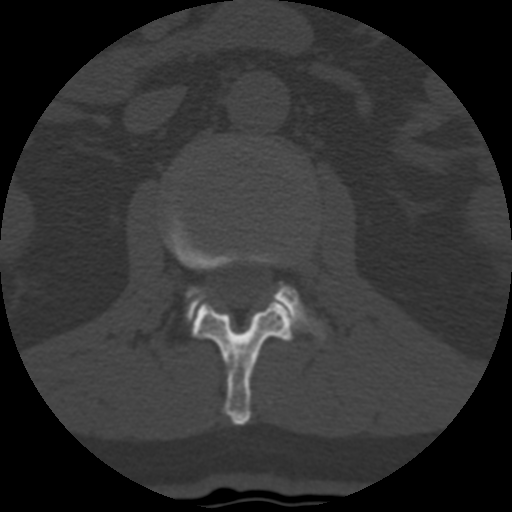

[Series 200: cor upper · coronal · 0.51mm/px · 6 of 71 slices shown]
[im 2/71  soft-tissue]
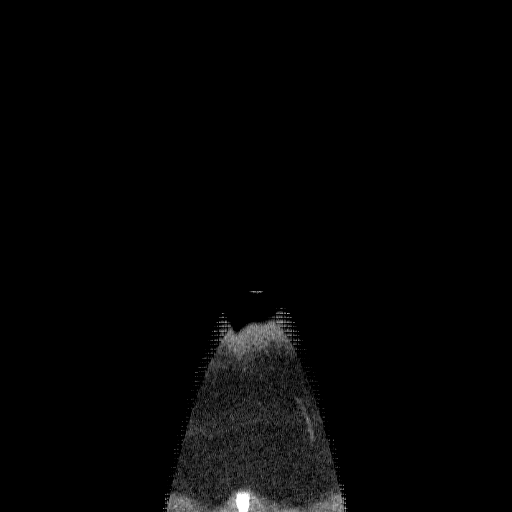
[im 12/71  bone]
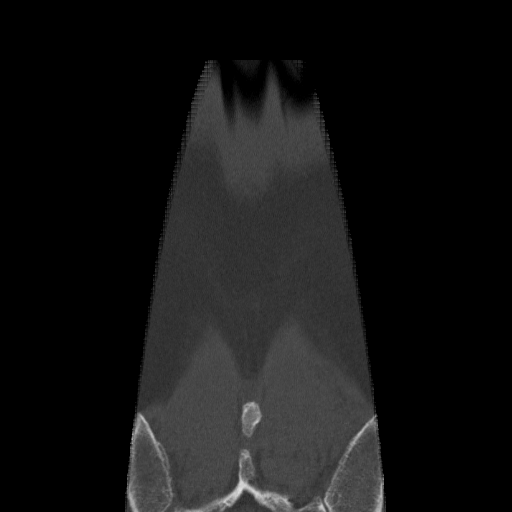
[im 24/71  bone]
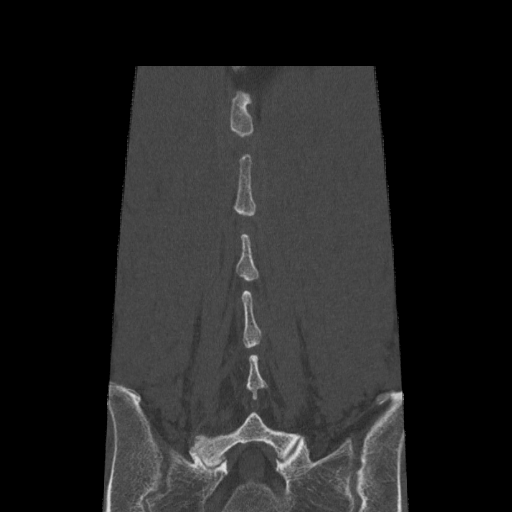
[im 36/71  bone]
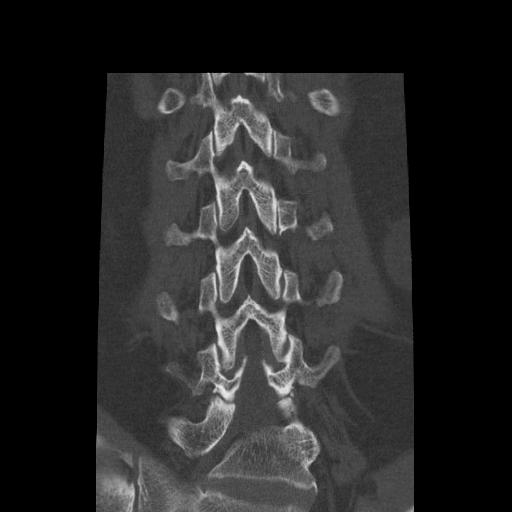
[im 47/71  bone]
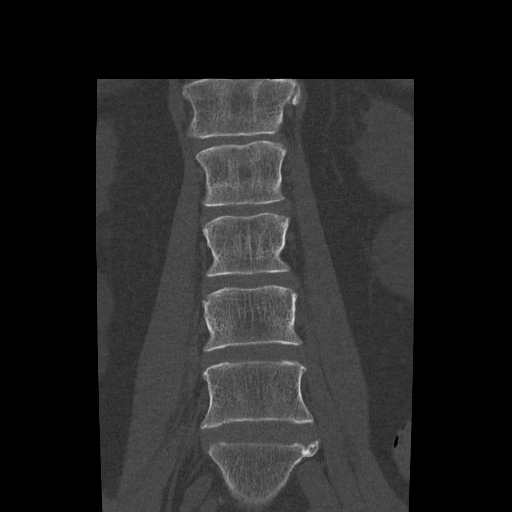
[im 59/71  bone]
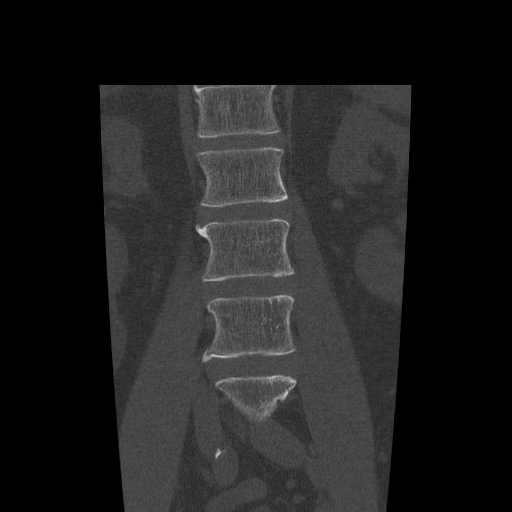

[Series 201: cor lower · coronal · 0.51mm/px · 3 of 71 slices shown]
[im 15/71  bone]
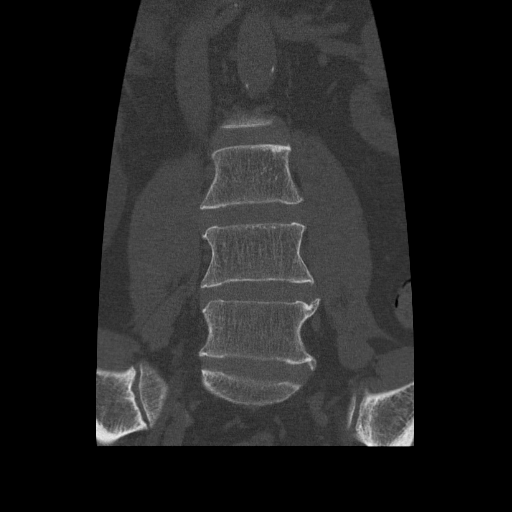
[im 29/71  bone]
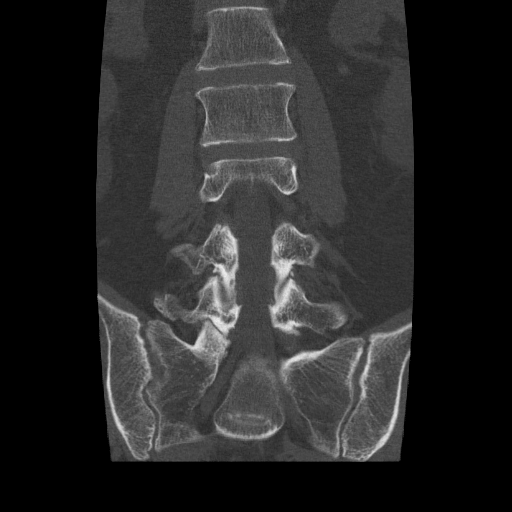
[im 43/71  bone]
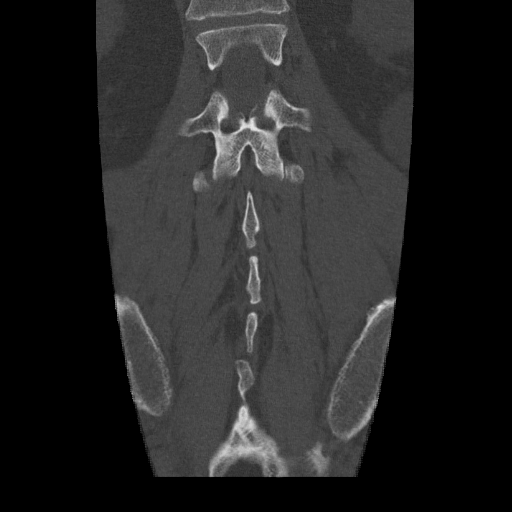

[14 of 34 positions shown; findings below may reference images not displayed]

FINDINGS: There is no evidence of fracture or focal bone lesion. No endplate
erosion or focal disc narrowing. No extra-spinal findings to explain
pain.

Partially visualized bilateral renal cysts.

Degenerative changes:

T12- L1: Unremarkable.

L1-L2: Unremarkable.

L2-L3: Mild spondylosis. There is slight bulging of the disc without
evidence of herniation. The canal and foramina are widely patent.

L3-L4: Spondylotic endplate spurring and slight disc bulging. Patent
canal and foramina.

L4-L5: Spondylotic endplate spurring and slight disc bulging. Patent
canal and foramina.

L5-S1:Mild spondylosis. No evidence of disc herniation or
significant facet arthropathy. No evidence of impingement.
IMPRESSION: 1. No acute osseous findings.
2. Mild spondylosis.  No stenosis to explain radicular symptoms.

## 2016-02-01 ENCOUNTER — Telehealth: Payer: Self-pay | Admitting: Cardiology

## 2016-02-01 ENCOUNTER — Ambulatory Visit (INDEPENDENT_AMBULATORY_CARE_PROVIDER_SITE_OTHER): Payer: Medicare Other | Admitting: *Deleted

## 2016-02-01 DIAGNOSIS — I442 Atrioventricular block, complete: Secondary | ICD-10-CM | POA: Diagnosis not present

## 2016-02-01 NOTE — Progress Notes (Signed)
Remote pacemaker transmission.   

## 2016-02-01 NOTE — Telephone Encounter (Signed)
LMOVM reminding pt to send remote transmission.   

## 2016-03-09 LAB — CUP PACEART INCLINIC DEVICE CHECK
Battery Voltage: 2.77 V
Brady Statistic AP VP Percent: 100 %
Brady Statistic AP VS Percent: 0 %
Brady Statistic AS VP Percent: 0 %
Brady Statistic AS VS Percent: 0 %
Date Time Interrogation Session: 20170425182240
Implantable Lead Implant Date: 19980414
Implantable Lead Location: 753860
Lead Channel Impedance Value: 743 Ohm
Lead Channel Pacing Threshold Amplitude: 0.625 V
Lead Channel Pacing Threshold Amplitude: 0.625 V
Lead Channel Pacing Threshold Pulse Width: 0.4 ms
Lead Channel Pacing Threshold Pulse Width: 0.4 ms
Lead Channel Setting Sensing Sensitivity: 2.8 mV
MDC IDC LEAD IMPLANT DT: 19980414
MDC IDC LEAD LOCATION: 753859
MDC IDC MSMT BATTERY IMPEDANCE: 1206 Ohm
MDC IDC MSMT BATTERY REMAINING LONGEVITY: 49 mo
MDC IDC MSMT LEADCHNL RA IMPEDANCE VALUE: 460 Ohm
MDC IDC SET LEADCHNL RA PACING AMPLITUDE: 2 V
MDC IDC SET LEADCHNL RV PACING AMPLITUDE: 2.5 V
MDC IDC SET LEADCHNL RV PACING PULSEWIDTH: 0.4 ms

## 2016-03-10 ENCOUNTER — Encounter: Payer: Self-pay | Admitting: Cardiology

## 2016-05-02 ENCOUNTER — Ambulatory Visit (INDEPENDENT_AMBULATORY_CARE_PROVIDER_SITE_OTHER): Payer: Medicare Other | Admitting: *Deleted

## 2016-05-02 ENCOUNTER — Telehealth: Payer: Self-pay | Admitting: Cardiology

## 2016-05-02 DIAGNOSIS — I495 Sick sinus syndrome: Secondary | ICD-10-CM

## 2016-05-02 NOTE — Telephone Encounter (Signed)
Confirmed remote transmission w/ pt wife.   

## 2016-05-03 ENCOUNTER — Encounter: Payer: Self-pay | Admitting: Cardiology

## 2016-05-03 NOTE — Progress Notes (Signed)
Remote pacemaker transmission.   

## 2016-05-04 LAB — CUP PACEART REMOTE DEVICE CHECK
Battery Remaining Longevity: 46 mo
Battery Voltage: 2.77 V
Brady Statistic AP VS Percent: 0 %
Brady Statistic AS VS Percent: 0 %
Implantable Lead Location: 753860
Lead Channel Impedance Value: 721 Ohm
Lead Channel Pacing Threshold Amplitude: 0.5 V
Lead Channel Pacing Threshold Pulse Width: 0.4 ms
Lead Channel Pacing Threshold Pulse Width: 0.4 ms
Lead Channel Setting Pacing Pulse Width: 0.4 ms
Lead Channel Setting Sensing Sensitivity: 2.8 mV
MDC IDC LEAD IMPLANT DT: 19980414
MDC IDC LEAD IMPLANT DT: 19980414
MDC IDC LEAD LOCATION: 753859
MDC IDC MSMT BATTERY IMPEDANCE: 1287 Ohm
MDC IDC MSMT LEADCHNL RA IMPEDANCE VALUE: 407 Ohm
MDC IDC MSMT LEADCHNL RA PACING THRESHOLD AMPLITUDE: 0.625 V
MDC IDC SESS DTM: 20170725211042
MDC IDC SET LEADCHNL RA PACING AMPLITUDE: 2 V
MDC IDC SET LEADCHNL RV PACING AMPLITUDE: 2.5 V
MDC IDC STAT BRADY AP VP PERCENT: 100 %
MDC IDC STAT BRADY AS VP PERCENT: 0 %

## 2016-08-09 ENCOUNTER — Encounter: Payer: Self-pay | Admitting: Internal Medicine

## 2016-08-09 ENCOUNTER — Ambulatory Visit (INDEPENDENT_AMBULATORY_CARE_PROVIDER_SITE_OTHER): Payer: Medicare Other | Admitting: Internal Medicine

## 2016-08-09 VITALS — BP 122/76 | HR 75 | Ht 72.0 in | Wt 203.2 lb

## 2016-08-09 DIAGNOSIS — Z950 Presence of cardiac pacemaker: Secondary | ICD-10-CM

## 2016-08-09 DIAGNOSIS — I442 Atrioventricular block, complete: Secondary | ICD-10-CM | POA: Diagnosis not present

## 2016-08-09 DIAGNOSIS — I495 Sick sinus syndrome: Secondary | ICD-10-CM | POA: Diagnosis not present

## 2016-08-09 DIAGNOSIS — I429 Cardiomyopathy, unspecified: Secondary | ICD-10-CM

## 2016-08-09 NOTE — Progress Notes (Signed)
  HPI  Luke Schmidt is a 75 y.o. male Seen in followup for pacemaker implantation years ago for complete heart block. He  underwent generator replacement 2010.   The patient denies chest pain, shortness of breath, nocturnal dyspnea, orthopnea or peripheral edema. There have been no palpitations, lightheadedness or syncope.   He is walking about 10,000 steps a day.  He is tolerating his medications without difficulty.  He is having no bleeding.   Past Medical History:  Diagnosis Date  . Complete heart block (HCC)   . HTN (hypertension)   . Hypertriglyceridemia   . Pacemaker    Medtronic adapta  L; generator replacement 2010  . Sinus node dysfunction [facility])     Past Surgical History:  Procedure Laterality Date  . PACEMAKER INSERTION     Medtronic     Current Outpatient Prescriptions  Medication Sig Dispense Refill  . amLODipine (NORVASC) 10 MG tablet Take 1 tablet (10 mg total) by mouth daily. 30 tablet 11  . aspirin 81 MG EC tablet Take 162 mg by mouth daily.      Marland Kitchen atorvastatin (LIPITOR) 10 MG tablet Take 10 mg by mouth daily.     . cholecalciferol (VITAMIN D) 1000 UNITS tablet Take 1,000 Units by mouth daily.    Marland Kitchen KLOR-CON 10 10 MEQ tablet Take 10 mEq by mouth daily.     Marland Kitchen losartan-hydrochlorothiazide (HYZAAR) 50-12.5 MG per tablet Take 1 tablet by mouth daily.       No current facility-administered medications for this visit.     Allergies  Allergen Reactions  . Penicillins     Hives     Review of Systems negative except from HPI and PMH  Physical Exam BP 122/76   Pulse 75   Ht 6' (1.829 m)   Wt 203 lb 3.2 oz (92.2 kg)   SpO2 95%   BMI 27.56 kg/m   Well developed and nourished in no acute distress HENT normal Neck supple with JVP-flat Clear Device pocket well healed; without hematoma or erythema.  There is no tethering  Regular rate and rhythm, no murmurs or gallops Abd-soft with active BS No Clubbing cyanosis edema Skin-warm and dry A &  Oriented  Grossly normal sensory and motor function  ECG AV  pacing  Assessment and  Plan  1 complete heart block  2 sinus node dysfunction  3 pacemaker-Medtronic  4-hypertension  The patient is doing very well. 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  We will leave things as they are so as not to "not fix something that it broke"  We will get an echo to look at LV fun given chronic RV pacing   Blood work is scheduled to be checked by Dr. Valentina Lucks in 2 months time

## 2016-08-09 NOTE — Patient Instructions (Addendum)
Medication Instructions: - Your physician recommends that you continue on your current medications as directed. Please refer to the Current Medication list given to you today.  Labwork: - none ordered  Procedures/Testing: - Your physician has requested that you have an echocardiogram. Echocardiography is a painless test that uses sound waves to create images of your heart. It provides your doctor with information about the size and shape of your heart and how well your heart's chambers and valves are working. This procedure takes approximately one hour. There are no restrictions for this procedure.  Follow-Up: - Remote monitoring is used to monitor your Pacemaker of ICD from home. This monitoring reduces the number of office visits required to check your device to one time per year. It allows [place] to keep an eye on the functioning of your device to ensure it is working properly. You are scheduled for a device check from home on 11/08/16. You may send your transmission at any time that day. If you have a wireless device, the transmission will be sent automatically. After your physician reviews your transmission, you will receive a postcard with your next transmission date.  - Your physician wants you to follow-up in: 1 year with Dr. Graciela Husbands. You will receive a reminder letter in the mail two months in advance. If you don't receive a letter, please call our office to schedule the follow-up appointment.  Any Additional Special Instructions Will Be Listed Below (If Applicable).     If you need a refill on your cardiac medications before your next appointment, please call your pharmacy.

## 2016-08-10 LAB — CUP PACEART INCLINIC DEVICE CHECK
Battery Impedance: 1394 Ohm
Battery Remaining Longevity: 43 mo
Battery Voltage: 2.76 V
Brady Statistic AP VP Percent: 100 %
Brady Statistic AP VS Percent: 0 %
Brady Statistic AS VP Percent: 0 %
Implantable Lead Implant Date: 19980414
Implantable Lead Location: 753859
Implantable Pulse Generator Implant Date: 20100419
Lead Channel Impedance Value: 425 Ohm
Lead Channel Pacing Threshold Amplitude: 0.5 V
Lead Channel Pacing Threshold Pulse Width: 0.4 ms
Lead Channel Pacing Threshold Pulse Width: 0.4 ms
Lead Channel Setting Pacing Amplitude: 2 V
Lead Channel Setting Pacing Amplitude: 2.5 V
Lead Channel Setting Pacing Pulse Width: 0.46 ms
MDC IDC LEAD IMPLANT DT: 19980414
MDC IDC LEAD LOCATION: 753860
MDC IDC MSMT LEADCHNL RV IMPEDANCE VALUE: 757 Ohm
MDC IDC MSMT LEADCHNL RV PACING THRESHOLD AMPLITUDE: 0.625 V
MDC IDC SESS DTM: 20171101232557
MDC IDC SET LEADCHNL RV SENSING SENSITIVITY: 2.8 mV
MDC IDC STAT BRADY AS VS PERCENT: 0 %

## 2016-09-12 ENCOUNTER — Ambulatory Visit (HOSPITAL_COMMUNITY): Payer: Medicare Other | Attending: Cardiology

## 2016-09-12 ENCOUNTER — Other Ambulatory Visit: Payer: Self-pay

## 2016-09-12 DIAGNOSIS — I429 Cardiomyopathy, unspecified: Secondary | ICD-10-CM | POA: Insufficient documentation

## 2016-09-12 DIAGNOSIS — I442 Atrioventricular block, complete: Secondary | ICD-10-CM | POA: Diagnosis not present

## 2016-09-12 DIAGNOSIS — I119 Hypertensive heart disease without heart failure: Secondary | ICD-10-CM | POA: Insufficient documentation

## 2016-09-15 ENCOUNTER — Telehealth: Payer: Self-pay | Admitting: Internal Medicine

## 2016-09-15 NOTE — Telephone Encounter (Signed)
Called patient back and informed patient Echo showed normal heart muscle function. Patient verbalized understanding and appreciated the call.

## 2016-09-15 NOTE — Telephone Encounter (Signed)
Follow Up:    Returning Luke Schmidt's call from yesterday.

## 2016-10-23 ENCOUNTER — Encounter: Payer: Self-pay | Admitting: Internal Medicine

## 2016-11-08 ENCOUNTER — Telehealth: Payer: Self-pay | Admitting: Cardiology

## 2016-11-08 ENCOUNTER — Ambulatory Visit (INDEPENDENT_AMBULATORY_CARE_PROVIDER_SITE_OTHER): Payer: Medicare Other | Admitting: *Deleted

## 2016-11-08 DIAGNOSIS — I495 Sick sinus syndrome: Secondary | ICD-10-CM

## 2016-11-08 NOTE — Telephone Encounter (Signed)
LMOVM reminding pt to send remote transmission.   

## 2016-11-09 ENCOUNTER — Encounter: Payer: Self-pay | Admitting: Cardiology

## 2016-11-09 NOTE — Progress Notes (Signed)
Remote pacemaker transmission.   

## 2016-11-12 LAB — CUP PACEART REMOTE DEVICE CHECK
Battery Impedance: 1559 Ohm
Battery Remaining Longevity: 40 mo
Battery Voltage: 2.76 V
Brady Statistic AP VP Percent: 100 %
Brady Statistic AP VS Percent: 0 %
Brady Statistic AS VP Percent: 0 %
Implantable Lead Implant Date: 19980414
Implantable Lead Location: 753860
Implantable Pulse Generator Implant Date: 20100419
Lead Channel Impedance Value: 425 Ohm
Lead Channel Pacing Threshold Amplitude: 0.5 V
Lead Channel Pacing Threshold Amplitude: 0.75 V
Lead Channel Pacing Threshold Pulse Width: 0.4 ms
Lead Channel Pacing Threshold Pulse Width: 0.4 ms
Lead Channel Setting Pacing Pulse Width: 0.4 ms
MDC IDC LEAD IMPLANT DT: 19980414
MDC IDC LEAD LOCATION: 753859
MDC IDC MSMT LEADCHNL RV IMPEDANCE VALUE: 732 Ohm
MDC IDC SESS DTM: 20180131205927
MDC IDC SET LEADCHNL RA PACING AMPLITUDE: 2 V
MDC IDC SET LEADCHNL RV PACING AMPLITUDE: 2.5 V
MDC IDC SET LEADCHNL RV SENSING SENSITIVITY: 2.8 mV
MDC IDC STAT BRADY AS VS PERCENT: 0 %

## 2017-02-07 ENCOUNTER — Ambulatory Visit (INDEPENDENT_AMBULATORY_CARE_PROVIDER_SITE_OTHER): Payer: Medicare Other | Admitting: *Deleted

## 2017-02-07 ENCOUNTER — Telehealth: Payer: Self-pay | Admitting: Cardiology

## 2017-02-07 DIAGNOSIS — I495 Sick sinus syndrome: Secondary | ICD-10-CM

## 2017-02-07 NOTE — Telephone Encounter (Signed)
LMOVM reminding pt to send remote transmission.   

## 2017-02-08 ENCOUNTER — Encounter: Payer: Self-pay | Admitting: Cardiology

## 2017-02-08 LAB — CUP PACEART REMOTE DEVICE CHECK
Battery Remaining Longevity: 39 mo
Battery Voltage: 2.76 V
Brady Statistic AP VP Percent: 100 %
Brady Statistic AP VS Percent: 0 %
Brady Statistic AS VP Percent: 0 %
Brady Statistic AS VS Percent: 0 %
Date Time Interrogation Session: 20180502185121
Implantable Lead Implant Date: 19980414
Implantable Lead Implant Date: 19980414
Implantable Lead Location: 753860
Implantable Pulse Generator Implant Date: 20100419
Lead Channel Impedance Value: 431 Ohm
Lead Channel Impedance Value: 752 Ohm
Lead Channel Setting Pacing Amplitude: 2 V
Lead Channel Setting Pacing Amplitude: 2.5 V
Lead Channel Setting Sensing Sensitivity: 2.8 mV
MDC IDC LEAD LOCATION: 753859
MDC IDC MSMT BATTERY IMPEDANCE: 1586 Ohm
MDC IDC MSMT LEADCHNL RA PACING THRESHOLD AMPLITUDE: 0.5 V
MDC IDC MSMT LEADCHNL RA PACING THRESHOLD PULSEWIDTH: 0.4 ms
MDC IDC MSMT LEADCHNL RV PACING THRESHOLD AMPLITUDE: 0.625 V
MDC IDC MSMT LEADCHNL RV PACING THRESHOLD PULSEWIDTH: 0.4 ms
MDC IDC SET LEADCHNL RV PACING PULSEWIDTH: 0.4 ms

## 2017-02-08 NOTE — Progress Notes (Signed)
Remote pacemaker transmission.   

## 2017-05-09 ENCOUNTER — Telehealth: Payer: Self-pay | Admitting: Cardiology

## 2017-05-09 ENCOUNTER — Encounter: Payer: Medicare Other | Admitting: *Deleted

## 2017-05-09 NOTE — Telephone Encounter (Signed)
Spoke with pt and reminded pt of remote transmission that is due today. Pt verbalized understanding.   

## 2017-09-07 ENCOUNTER — Encounter: Payer: Self-pay | Admitting: Internal Medicine

## 2017-09-07 ENCOUNTER — Ambulatory Visit: Payer: Medicare Other | Admitting: Internal Medicine

## 2017-09-07 VITALS — BP 122/64 | HR 67 | Ht 72.0 in | Wt 205.0 lb

## 2017-09-07 DIAGNOSIS — I495 Sick sinus syndrome: Secondary | ICD-10-CM | POA: Diagnosis not present

## 2017-09-07 DIAGNOSIS — I442 Atrioventricular block, complete: Secondary | ICD-10-CM

## 2017-09-07 DIAGNOSIS — Z950 Presence of cardiac pacemaker: Secondary | ICD-10-CM | POA: Diagnosis not present

## 2017-09-07 DIAGNOSIS — I429 Cardiomyopathy, unspecified: Secondary | ICD-10-CM

## 2017-09-07 LAB — CUP PACEART INCLINIC DEVICE CHECK
Battery Remaining Longevity: 34 mo
Battery Voltage: 2.75 V
Brady Statistic AP VP Percent: 100 %
Brady Statistic AS VP Percent: 0 %
Date Time Interrogation Session: 20181130155358
Implantable Lead Implant Date: 19980414
Implantable Lead Implant Date: 19980414
Implantable Lead Location: 753859
Implantable Lead Location: 753860
Implantable Pulse Generator Implant Date: 20100419
Lead Channel Impedance Value: 456 Ohm
Lead Channel Impedance Value: 766 Ohm
Lead Channel Pacing Threshold Amplitude: 0.5 V
Lead Channel Pacing Threshold Amplitude: 0.5 V
Lead Channel Pacing Threshold Pulse Width: 0.4 ms
Lead Channel Pacing Threshold Pulse Width: 0.4 ms
Lead Channel Pacing Threshold Pulse Width: 0.4 ms
Lead Channel Setting Pacing Amplitude: 2 V
Lead Channel Setting Pacing Amplitude: 2.5 V
Lead Channel Setting Pacing Pulse Width: 0.4 ms
MDC IDC MSMT BATTERY IMPEDANCE: 1871 Ohm
MDC IDC MSMT LEADCHNL RA PACING THRESHOLD AMPLITUDE: 0.375 V
MDC IDC MSMT LEADCHNL RV PACING THRESHOLD AMPLITUDE: 0.75 V
MDC IDC MSMT LEADCHNL RV PACING THRESHOLD PULSEWIDTH: 0.4 ms
MDC IDC SET LEADCHNL RV SENSING SENSITIVITY: 2.8 mV
MDC IDC STAT BRADY AP VS PERCENT: 0 %
MDC IDC STAT BRADY AS VS PERCENT: 0 %

## 2017-09-07 NOTE — Progress Notes (Signed)
  HPI  Luke Schmidt is a 76 y.o. male Seen in followup for pacemaker implantation years ago for complete heart block. He  underwent generator replacement 2010.   He ate just unrelated the most unfortunate news to me that he is a [facility] football fan and travels the country watching football.  He is tolerating his medications without difficulty.  He is having no bleeding.  He denies chest pain shortness of breath peripheral edema  EF 55-60%    Past Medical History:  Diagnosis Date  . Complete heart block (HCC)   . HTN (hypertension)   . Hypertriglyceridemia   . Pacemaker    Medtronic adapta  L; generator replacement 2010  . Sinus node dysfunction [facility])     Past Surgical History:  Procedure Laterality Date  . PACEMAKER INSERTION     Medtronic     Current Outpatient Medications  Medication Sig Dispense Refill  . amLODipine (NORVASC) 10 MG tablet Take 1 tablet (10 mg total) by mouth daily. 30 tablet 11  . aspirin 81 MG EC tablet Take 162 mg by mouth daily.      Marland Kitchen atorvastatin (LIPITOR) 10 MG tablet Take 10 mg by mouth daily.     . cholecalciferol (VITAMIN D) 1000 UNITS tablet Take 1,000 Units by mouth daily.    Marland Kitchen KLOR-CON 10 10 MEQ tablet Take 10 mEq by mouth daily.     Marland Kitchen losartan-hydrochlorothiazide (HYZAAR) 50-12.5 MG per tablet Take 1 tablet by mouth daily.       No current facility-administered medications for this visit.     Allergies  Allergen Reactions  . Penicillins     Hives     Review of Systems negative except from HPI and PMH  Physical Exam BP 122/64   Pulse 67   Ht 6' (1.829 m)   Wt 205 lb (93 kg)   SpO2 98%   BMI 27.80 kg/m  Well developed and nourished in no acute distress HENT normal Neck supple with JVP-flat Clear Device pocket well healed; without hematoma or erythema.  There is no tethering Regular rate and rhythm, no murmurs or gallops Abd-soft with active BS No Clubbing cyanosis edema Skin-warm and dry A & Oriented   Grossly normal sensory and motor function   ECG AV pacing with am going to her next  Assessment and  Plan  1 complete heart block  2 sinus node dysfunction  3 pacemaker-Medtronic  4-hypertension   Blood pressure is well controlled  devicie fucntion normal

## 2017-09-07 NOTE — Patient Instructions (Signed)
Medication Instructions:  Your physician recommends that you continue on your current medications as directed. Please refer to the Current Medication list given to you today.  -- If you need a refill on your cardiac medications before your next appointment, please call your pharmacy. --  Labwork: None ordered  Testing/Procedures: None ordered  Follow-Up: Remote monitoring is used to monitor your Pacemaker  from home. This monitoring reduces the number of office visits required to check your device to one time per year. It allows us to keep an eye on the functioning of your device to ensure it is working properly. You are scheduled for a device check from home on 12/10/2017. You may send your transmission at any time that day. If you have a wireless device, the transmission will be sent automatically. After your physician reviews your transmission, you will receive a postcard with your next transmission date.   Your physician wants you to follow-up in: 1 year with Dr. Klein.    You will receive a reminder letter in the mail two months in advance. If you don't receive a letter, please call our office to schedule the follow-up appointment.  Thank you for choosing [facility]!!   [phone]  Any Other Special Instructions Will Be Listed Below (If Applicable).         

## 2017-09-11 NOTE — Addendum Note (Signed)
Addended by: Dareen Piano on: 09/11/2017 07:26 AM   Modules accepted: Orders

## 2017-12-10 ENCOUNTER — Encounter: Payer: Medicare Other | Admitting: *Deleted

## 2017-12-10 ENCOUNTER — Telehealth: Payer: Self-pay | Admitting: Cardiology

## 2017-12-10 NOTE — Telephone Encounter (Signed)
LMOVM reminding pt to send remote transmission.   

## 2017-12-13 ENCOUNTER — Encounter: Payer: Self-pay | Admitting: Cardiology

## 2017-12-19 ENCOUNTER — Telehealth: Payer: Self-pay | Admitting: Internal Medicine

## 2017-12-19 NOTE — Telephone Encounter (Signed)
New message    1. Has your device fired? no  2. Is you device beeping? no  3. Are you experiencing draining or swelling at device site? no  4. Are you calling to see if we received your device transmission? yes  5. Have you passed out? no  Pt states that he received a letter that his transmission on 3/4 did not go through so he would like to try and send it again today. Please call  Please route to Device Clinic Pool

## 2017-12-19 NOTE — Telephone Encounter (Signed)
Spoke with pt, pt does not have a wirex cell adapter needed in order to send transmission. Informed pt that I would fill out a request for Medtronic to contact him regarding new wirex adapter or new home monitor. Pt voiced understanding.

## 2017-12-27 ENCOUNTER — Ambulatory Visit (INDEPENDENT_AMBULATORY_CARE_PROVIDER_SITE_OTHER): Payer: Medicare Other | Admitting: *Deleted

## 2017-12-27 DIAGNOSIS — I495 Sick sinus syndrome: Secondary | ICD-10-CM

## 2017-12-28 ENCOUNTER — Encounter: Payer: Self-pay | Admitting: Cardiology

## 2017-12-28 NOTE — Progress Notes (Signed)
Remote pacemaker transmission.   

## 2018-01-18 LAB — CUP PACEART REMOTE DEVICE CHECK
Battery Impedance: 2077 Ohm
Battery Remaining Longevity: 30 mo
Brady Statistic AP VP Percent: 100 %
Brady Statistic AS VS Percent: 0 %
Date Time Interrogation Session: 20190321164804
Implantable Lead Implant Date: 19980414
Implantable Lead Implant Date: 19980414
Implantable Lead Location: 753859
Implantable Lead Location: 753860
Implantable Pulse Generator Implant Date: 20100419
Lead Channel Impedance Value: 455 Ohm
Lead Channel Pacing Threshold Pulse Width: 0.4 ms
Lead Channel Setting Pacing Amplitude: 2 V
Lead Channel Setting Pacing Amplitude: 2.5 V
Lead Channel Setting Pacing Pulse Width: 0.4 ms
Lead Channel Setting Sensing Sensitivity: 2.8 mV
MDC IDC MSMT BATTERY VOLTAGE: 2.75 V
MDC IDC MSMT LEADCHNL RA PACING THRESHOLD AMPLITUDE: 0.375 V
MDC IDC MSMT LEADCHNL RA PACING THRESHOLD PULSEWIDTH: 0.4 ms
MDC IDC MSMT LEADCHNL RV IMPEDANCE VALUE: 776 Ohm
MDC IDC MSMT LEADCHNL RV PACING THRESHOLD AMPLITUDE: 0.75 V
MDC IDC STAT BRADY AP VS PERCENT: 0 %
MDC IDC STAT BRADY AS VP PERCENT: 0 %

## 2018-03-28 ENCOUNTER — Telehealth: Payer: Self-pay | Admitting: Cardiology

## 2018-03-28 ENCOUNTER — Ambulatory Visit (INDEPENDENT_AMBULATORY_CARE_PROVIDER_SITE_OTHER): Payer: Medicare Other | Admitting: *Deleted

## 2018-03-28 DIAGNOSIS — I4510 Unspecified right bundle-branch block: Secondary | ICD-10-CM

## 2018-03-28 DIAGNOSIS — I442 Atrioventricular block, complete: Secondary | ICD-10-CM

## 2018-03-28 NOTE — Telephone Encounter (Signed)
Spoke with pt and reminded pt of remote transmission that is due today. Pt verbalized understanding.   

## 2018-03-29 NOTE — Progress Notes (Signed)
Remote pacemaker transmission.   

## 2018-04-16 LAB — CUP PACEART REMOTE DEVICE CHECK
Battery Impedance: 2114 Ohm
Battery Remaining Longevity: 30 mo
Brady Statistic AP VP Percent: 100 %
Brady Statistic AS VP Percent: 0 %
Brady Statistic AS VS Percent: 0 %
Date Time Interrogation Session: 20190620221311
Implantable Lead Implant Date: 19980414
Implantable Lead Implant Date: 19980414
Implantable Lead Location: 753859
Implantable Pulse Generator Implant Date: 20100419
Lead Channel Impedance Value: 429 Ohm
Lead Channel Pacing Threshold Amplitude: 0.375 V
Lead Channel Pacing Threshold Amplitude: 0.625 V
Lead Channel Pacing Threshold Pulse Width: 0.4 ms
Lead Channel Setting Pacing Amplitude: 2 V
Lead Channel Setting Pacing Pulse Width: 0.4 ms
Lead Channel Setting Sensing Sensitivity: 2.8 mV
MDC IDC LEAD LOCATION: 753860
MDC IDC MSMT BATTERY VOLTAGE: 2.75 V
MDC IDC MSMT LEADCHNL RA PACING THRESHOLD PULSEWIDTH: 0.4 ms
MDC IDC MSMT LEADCHNL RV IMPEDANCE VALUE: 799 Ohm
MDC IDC SET LEADCHNL RV PACING AMPLITUDE: 2.5 V
MDC IDC STAT BRADY AP VS PERCENT: 0 %

## 2018-06-27 ENCOUNTER — Ambulatory Visit (INDEPENDENT_AMBULATORY_CARE_PROVIDER_SITE_OTHER): Payer: Medicare Other | Admitting: *Deleted

## 2018-06-27 ENCOUNTER — Telehealth: Payer: Self-pay | Admitting: Cardiology

## 2018-06-27 DIAGNOSIS — I442 Atrioventricular block, complete: Secondary | ICD-10-CM | POA: Diagnosis not present

## 2018-06-27 DIAGNOSIS — I4510 Unspecified right bundle-branch block: Secondary | ICD-10-CM

## 2018-06-27 NOTE — Telephone Encounter (Signed)
Confirmed remote transmission w/ pt wife.   

## 2018-06-28 NOTE — Progress Notes (Signed)
Remote pacemaker transmission.   

## 2018-07-10 LAB — CUP PACEART REMOTE DEVICE CHECK
Battery Impedance: 2208 Ohm
Battery Remaining Longevity: 28 mo
Battery Voltage: 2.75 V
Brady Statistic AP VS Percent: 0 %
Brady Statistic AS VP Percent: 0 %
Brady Statistic AS VS Percent: 0 %
Date Time Interrogation Session: 20190919215305
Implantable Lead Implant Date: 19980414
Implantable Lead Implant Date: 19980414
Implantable Lead Location: 753860
Implantable Pulse Generator Implant Date: 20100419
Lead Channel Impedance Value: 749 Ohm
Lead Channel Pacing Threshold Amplitude: 0.375 V
Lead Channel Pacing Threshold Pulse Width: 0.4 ms
Lead Channel Pacing Threshold Pulse Width: 0.4 ms
Lead Channel Setting Pacing Amplitude: 2.5 V
Lead Channel Setting Pacing Pulse Width: 0.4 ms
MDC IDC LEAD LOCATION: 753859
MDC IDC MSMT LEADCHNL RA IMPEDANCE VALUE: 427 Ohm
MDC IDC MSMT LEADCHNL RV PACING THRESHOLD AMPLITUDE: 0.5 V
MDC IDC SET LEADCHNL RA PACING AMPLITUDE: 2 V
MDC IDC SET LEADCHNL RV SENSING SENSITIVITY: 2.8 mV
MDC IDC STAT BRADY AP VP PERCENT: 100 %

## 2018-08-12 ENCOUNTER — Encounter: Payer: Self-pay | Admitting: Internal Medicine

## 2018-09-02 ENCOUNTER — Encounter: Payer: Self-pay | Admitting: Internal Medicine

## 2018-09-02 ENCOUNTER — Ambulatory Visit: Payer: Medicare Other | Admitting: Internal Medicine

## 2018-09-02 VITALS — BP 132/80 | HR 75 | Ht 72.0 in | Wt 206.2 lb

## 2018-09-02 DIAGNOSIS — I4510 Unspecified right bundle-branch block: Secondary | ICD-10-CM

## 2018-09-02 DIAGNOSIS — I495 Sick sinus syndrome: Secondary | ICD-10-CM | POA: Diagnosis not present

## 2018-09-02 DIAGNOSIS — Z950 Presence of cardiac pacemaker: Secondary | ICD-10-CM | POA: Diagnosis not present

## 2018-09-02 DIAGNOSIS — I442 Atrioventricular block, complete: Secondary | ICD-10-CM | POA: Diagnosis not present

## 2018-09-02 DIAGNOSIS — I429 Cardiomyopathy, unspecified: Secondary | ICD-10-CM

## 2018-09-02 LAB — CUP PACEART INCLINIC DEVICE CHECK
Battery Impedance: 2290 Ohm
Battery Remaining Longevity: 27 mo
Battery Voltage: 2.74 V
Brady Statistic AP VP Percent: 99 %
Brady Statistic AP VS Percent: 0 %
Brady Statistic AS VP Percent: 0 %
Implantable Lead Implant Date: 19980414
Implantable Lead Implant Date: 19980414
Implantable Lead Location: 753859
Implantable Pulse Generator Implant Date: 20100419
Lead Channel Impedance Value: 438 Ohm
Lead Channel Impedance Value: 747 Ohm
Lead Channel Pacing Threshold Amplitude: 0.5 V
Lead Channel Pacing Threshold Pulse Width: 0.4 ms
Lead Channel Pacing Threshold Pulse Width: 0.4 ms
Lead Channel Pacing Threshold Pulse Width: 0.4 ms
Lead Channel Setting Pacing Amplitude: 2 V
Lead Channel Setting Pacing Amplitude: 2.5 V
Lead Channel Setting Pacing Pulse Width: 0.4 ms
Lead Channel Setting Sensing Sensitivity: 2.8 mV
MDC IDC LEAD LOCATION: 753860
MDC IDC MSMT LEADCHNL RA PACING THRESHOLD AMPLITUDE: 0.375 V
MDC IDC MSMT LEADCHNL RA PACING THRESHOLD AMPLITUDE: 0.5 V
MDC IDC MSMT LEADCHNL RV PACING THRESHOLD AMPLITUDE: 0.5 V
MDC IDC MSMT LEADCHNL RV PACING THRESHOLD PULSEWIDTH: 0.4 ms
MDC IDC SESS DTM: 20191125145518
MDC IDC STAT BRADY AS VS PERCENT: 0 %

## 2018-09-02 NOTE — Progress Notes (Signed)
  HPI  Luke Schmidt is a 77 y.o. male Seen in followup for pacemaker implantation years ago for complete heart block. He  underwent generator replacement 2010.   He  just unrelated the most unfortunate news to me that he is a [facility] football fan and travels the country watching football.  The patient denies chest pain, shortness of breath, nocturnal dyspnea, orthopnea or peripheral edema.  There have been no palpitations, lightheadedness or syncope.       Past Medical History:  Diagnosis Date  . Complete heart block (HCC)   . HTN (hypertension)   . Hypertriglyceridemia   . Pacemaker    Medtronic adapta  L; generator replacement 2010  . Sinus node dysfunction [facility])     Past Surgical History:  Procedure Laterality Date  . PACEMAKER INSERTION     Medtronic     Current Outpatient Medications  Medication Sig Dispense Refill  . amLODipine (NORVASC) 10 MG tablet Take 1 tablet (10 mg total) by mouth daily. 30 tablet 11  . aspirin 81 MG EC tablet Take 162 mg by mouth daily.      Marland Kitchen atorvastatin (LIPITOR) 10 MG tablet Take 10 mg by mouth daily.     . cholecalciferol (VITAMIN D) 1000 UNITS tablet Take 1,000 Units by mouth daily.    Marland Kitchen KLOR-CON 10 10 MEQ tablet Take 10 mEq by mouth daily.     Marland Kitchen losartan-hydrochlorothiazide (HYZAAR) 50-12.5 MG per tablet Take 1 tablet by mouth daily.       No current facility-administered medications for this visit.     Allergies  Allergen Reactions  . Penicillins     Hives     Review of Systems negative except from HPI and PMH  Physical Exam BP 132/80   Pulse 75   Ht 6' (1.829 m)   Wt 206 lb 3.2 oz (93.5 kg)   SpO2 95%   BMI 27.97 kg/m  Well developed and nourished in no acute distress HENT normal Neck supple with JVP-flat Clear Device pocket well healed; without hematoma or erythema.  There is no tethering  Regular rate and rhythm, no murmurs or gallops Abd-soft with active BS No Clubbing cyanosis edema Skin-warm and  dry A & Oriented  Grossly normal sensory and motor function\   ECG AV pacing   Assessment and  Plan   complete heart block   sinus node dysfunction  pacemaker-Medtronic  hypertension   BP well controlled  Device function normal

## 2018-09-02 NOTE — Patient Instructions (Signed)
Medication Instructions:  Your physician recommends that you continue on your current medications as directed. Please refer to the Current Medication list given to you today.  Labwork: None ordered.  Testing/Procedures: None ordered.  Follow-Up: Your physician recommends that you schedule a follow-up appointment in:   One Year with Dr Graciela Husbands  Remote monitoring is used to monitor your Pacemaker of ICD from home. This monitoring reduces the number of office visits required to check your device to one time per year. It allows [place] to keep an eye on the functioning of your device to ensure it is working properly. You are scheduled for a device check from home on 12/19. You may send your transmission at any time that day. If you have a wireless device, the transmission will be sent automatically. After your physician reviews your transmission, you will receive a postcard with your next transmission date.    Any Other Special Instructions Will Be Listed Below (If Applicable).     If you need a refill on your cardiac medications before your next appointment, please call your pharmacy.

## 2018-09-26 ENCOUNTER — Ambulatory Visit (INDEPENDENT_AMBULATORY_CARE_PROVIDER_SITE_OTHER): Payer: Medicare Other

## 2018-09-26 ENCOUNTER — Telehealth: Payer: Self-pay

## 2018-09-26 DIAGNOSIS — I4510 Unspecified right bundle-branch block: Secondary | ICD-10-CM

## 2018-09-26 DIAGNOSIS — I442 Atrioventricular block, complete: Secondary | ICD-10-CM

## 2018-09-26 NOTE — Telephone Encounter (Signed)
LMOVM reminding pt to send remote transmission.   

## 2018-09-27 NOTE — Progress Notes (Signed)
Remote pacemaker transmission.   

## 2018-10-27 LAB — CUP PACEART REMOTE DEVICE CHECK
Battery Impedance: 2247 Ohm
Battery Remaining Longevity: 28 mo
Battery Voltage: 2.74 V
Brady Statistic AP VP Percent: 100 %
Brady Statistic AS VP Percent: 0 %
Date Time Interrogation Session: 20191220135914
Implantable Lead Implant Date: 19980414
Implantable Lead Implant Date: 19980414
Implantable Lead Location: 753859
Implantable Pulse Generator Implant Date: 20100419
Lead Channel Impedance Value: 429 Ohm
Lead Channel Pacing Threshold Amplitude: 0.375 V
Lead Channel Pacing Threshold Amplitude: 0.625 V
Lead Channel Pacing Threshold Pulse Width: 0.4 ms
Lead Channel Setting Pacing Amplitude: 2 V
Lead Channel Setting Pacing Amplitude: 2.5 V
Lead Channel Setting Pacing Pulse Width: 0.4 ms
MDC IDC LEAD LOCATION: 753860
MDC IDC MSMT LEADCHNL RA PACING THRESHOLD PULSEWIDTH: 0.4 ms
MDC IDC MSMT LEADCHNL RV IMPEDANCE VALUE: 746 Ohm
MDC IDC SET LEADCHNL RV SENSING SENSITIVITY: 2.8 mV
MDC IDC STAT BRADY AP VS PERCENT: 0 %
MDC IDC STAT BRADY AS VS PERCENT: 0 %

## 2018-11-18 ENCOUNTER — Telehealth: Payer: Self-pay | Admitting: *Deleted

## 2018-11-18 NOTE — Telephone Encounter (Signed)
   [facility] Pre-operative Risk Assessment    Request for surgical clearance:  1. What type of surgery is being performed? LEFT FINGER DIP JOINT ARTHOPDESIS   2. When is this surgery scheduled? TBD   3. What type of clearance is required (medical clearance vs. Pharmacy clearance to hold med vs. Both)? MEDICAL  4. Are there any medications that need to be held prior to surgery and how long?ASA   5. Practice name and name of physician performing surgery? [facility]; DR. Apolonio Schneiders   6. What is your office phone number [phone]    7.   What is your office fax number [phone]  8.   Anesthesia type (None, local, MAC, general) ? LOCAL W/SEDATION   Julaine Hua 11/18/2018, 2:42 PM  _________________________________________________________________   (provider comments below)

## 2018-11-20 ENCOUNTER — Other Ambulatory Visit: Payer: Self-pay | Admitting: Physician Assistant

## 2018-11-20 NOTE — Telephone Encounter (Signed)
   Primary Cardiologist: [name], MD  Chart reviewed as part of pre-operative protocol coverage. Patient was contacted 11/20/2018 in reference to pre-operative risk assessment for pending surgery as outlined below.  Luke Schmidt was last seen on 09/02/2018 by Dr. Graciela Husbands.  Since that day, Luke Schmidt has done well without any chest pain or shortness of breath.  Therefore, based on ACC/AHA guidelines, the patient would be at acceptable risk for the planned procedure without further cardiovascular testing.   I will route this recommendation to the requesting party via Epic fax function and remove from pre-op pool.  Please call with questions. His pacemaker was interrogated in Dec 2019 and was functioning normally. Patient says he previous took aspirin for preventative effect, however he has not taken any aspirin for the past 18 month.   [place], [place] 11/20/2018, 2:24 PM

## 2018-12-26 ENCOUNTER — Ambulatory Visit (INDEPENDENT_AMBULATORY_CARE_PROVIDER_SITE_OTHER): Payer: Medicare Other | Admitting: *Deleted

## 2018-12-26 ENCOUNTER — Other Ambulatory Visit: Payer: Self-pay

## 2018-12-26 DIAGNOSIS — I4510 Unspecified right bundle-branch block: Secondary | ICD-10-CM

## 2018-12-26 DIAGNOSIS — I442 Atrioventricular block, complete: Secondary | ICD-10-CM

## 2018-12-27 ENCOUNTER — Telehealth: Payer: Self-pay

## 2018-12-27 LAB — CUP PACEART REMOTE DEVICE CHECK
Battery Impedance: 2293 Ohm
Battery Remaining Longevity: 27 mo
Battery Voltage: 2.74 V
Brady Statistic AP VP Percent: 100 %
Brady Statistic AS VS Percent: 0 %
Date Time Interrogation Session: 20200320122916
Implantable Lead Implant Date: 19980414
Implantable Lead Location: 753859
Implantable Pulse Generator Implant Date: 20100419
Lead Channel Impedance Value: 422 Ohm
Lead Channel Impedance Value: 810 Ohm
Lead Channel Pacing Threshold Amplitude: 0.625 V
Lead Channel Pacing Threshold Pulse Width: 0.4 ms
Lead Channel Setting Pacing Amplitude: 2 V
Lead Channel Setting Pacing Amplitude: 2.5 V
Lead Channel Setting Pacing Pulse Width: 0.4 ms
Lead Channel Setting Sensing Sensitivity: 2.8 mV
MDC IDC LEAD IMPLANT DT: 19980414
MDC IDC LEAD LOCATION: 753860
MDC IDC MSMT LEADCHNL RA PACING THRESHOLD AMPLITUDE: 0.375 V
MDC IDC MSMT LEADCHNL RA PACING THRESHOLD PULSEWIDTH: 0.4 ms
MDC IDC STAT BRADY AP VS PERCENT: 0 %
MDC IDC STAT BRADY AS VP PERCENT: 0 %

## 2018-12-27 NOTE — Telephone Encounter (Signed)
Spoke with patient to remind of missed remote transmission 

## 2019-01-02 NOTE — Progress Notes (Signed)
Remote pacemaker transmission.   

## 2019-03-27 ENCOUNTER — Ambulatory Visit (INDEPENDENT_AMBULATORY_CARE_PROVIDER_SITE_OTHER): Payer: Medicare Other | Admitting: *Deleted

## 2019-03-27 DIAGNOSIS — I4510 Unspecified right bundle-branch block: Secondary | ICD-10-CM

## 2019-03-27 DIAGNOSIS — I442 Atrioventricular block, complete: Secondary | ICD-10-CM

## 2019-03-27 LAB — CUP PACEART REMOTE DEVICE CHECK
Battery Impedance: 2387 Ohm
Battery Remaining Longevity: 27 mo
Battery Voltage: 2.74 V
Brady Statistic AP VP Percent: 100 %
Brady Statistic AP VS Percent: 0 %
Brady Statistic AS VP Percent: 0 %
Brady Statistic AS VS Percent: 0 %
Date Time Interrogation Session: 20200618114206
Implantable Lead Implant Date: 19980414
Implantable Lead Implant Date: 19980414
Implantable Lead Location: 753859
Implantable Lead Location: 753860
Implantable Pulse Generator Implant Date: 20100419
Lead Channel Impedance Value: 445 Ohm
Lead Channel Impedance Value: 857 Ohm
Lead Channel Pacing Threshold Amplitude: 0.375 V
Lead Channel Pacing Threshold Amplitude: 0.625 V
Lead Channel Pacing Threshold Pulse Width: 0.4 ms
Lead Channel Pacing Threshold Pulse Width: 0.4 ms
Lead Channel Setting Pacing Amplitude: 2 V
Lead Channel Setting Pacing Amplitude: 2.5 V
Lead Channel Setting Pacing Pulse Width: 0.4 ms
Lead Channel Setting Sensing Sensitivity: 2.8 mV

## 2019-03-31 NOTE — Progress Notes (Signed)
Remote pacemaker transmission.   

## 2019-06-27 ENCOUNTER — Ambulatory Visit: Payer: Medicare Other | Admitting: *Deleted

## 2019-06-28 LAB — CUP PACEART REMOTE DEVICE CHECK
Battery Impedance: 2384 Ohm
Battery Remaining Longevity: 26 mo
Battery Voltage: 2.73 V
Brady Statistic AP VP Percent: 100 %
Brady Statistic AP VS Percent: 0 %
Brady Statistic AS VP Percent: 0 %
Brady Statistic AS VS Percent: 0 %
Date Time Interrogation Session: 20200919000152
Implantable Lead Implant Date: 19980414
Implantable Lead Implant Date: 19980414
Implantable Lead Location: 753859
Implantable Lead Location: 753860
Implantable Pulse Generator Implant Date: 20100419
Lead Channel Impedance Value: 423 Ohm
Lead Channel Impedance Value: 781 Ohm
Lead Channel Pacing Threshold Amplitude: 0.375 V
Lead Channel Pacing Threshold Amplitude: 0.625 V
Lead Channel Pacing Threshold Pulse Width: 0.4 ms
Lead Channel Pacing Threshold Pulse Width: 0.4 ms
Lead Channel Setting Pacing Amplitude: 2 V
Lead Channel Setting Pacing Amplitude: 2.5 V
Lead Channel Setting Pacing Pulse Width: 0.4 ms
Lead Channel Setting Sensing Sensitivity: 2.8 mV

## 2019-08-06 ENCOUNTER — Telehealth: Payer: Self-pay

## 2019-08-08 NOTE — Telephone Encounter (Signed)
error 

## 2019-09-26 ENCOUNTER — Ambulatory Visit (INDEPENDENT_AMBULATORY_CARE_PROVIDER_SITE_OTHER): Payer: Medicare Other | Admitting: *Deleted

## 2019-09-26 DIAGNOSIS — I442 Atrioventricular block, complete: Secondary | ICD-10-CM | POA: Diagnosis not present

## 2019-09-26 LAB — CUP PACEART REMOTE DEVICE CHECK
Battery Impedance: 2808 Ohm
Battery Remaining Longevity: 21 mo
Battery Voltage: 2.73 V
Brady Statistic AP VP Percent: 100 %
Brady Statistic AP VS Percent: 0 %
Brady Statistic AS VP Percent: 0 %
Brady Statistic AS VS Percent: 0 %
Date Time Interrogation Session: 20201218095607
Implantable Lead Implant Date: 19980414
Implantable Lead Implant Date: 19980414
Implantable Lead Location: 753859
Implantable Lead Location: 753860
Implantable Pulse Generator Implant Date: 20100419
Lead Channel Impedance Value: 433 Ohm
Lead Channel Impedance Value: 773 Ohm
Lead Channel Pacing Threshold Amplitude: 0.375 V
Lead Channel Pacing Threshold Amplitude: 0.5 V
Lead Channel Pacing Threshold Pulse Width: 0.4 ms
Lead Channel Pacing Threshold Pulse Width: 0.4 ms
Lead Channel Setting Pacing Amplitude: 2 V
Lead Channel Setting Pacing Amplitude: 2.5 V
Lead Channel Setting Pacing Pulse Width: 0.46 ms
Lead Channel Setting Sensing Sensitivity: 2.8 mV

## 2019-10-15 NOTE — Progress Notes (Signed)
PPM remote 

## 2019-10-22 ENCOUNTER — Ambulatory Visit: Payer: Medicare Other | Attending: Internal Medicine

## 2019-10-22 DIAGNOSIS — Z23 Encounter for immunization: Secondary | ICD-10-CM | POA: Insufficient documentation

## 2019-10-22 NOTE — Progress Notes (Signed)
   Covid-19 Vaccination Clinic  Name:  Luke Schmidt    MRN: [id number] DOB: [birth year]  10/22/2019  [name] was observed post Covid-19 immunization for 15 minutes without incidence. He was provided with Vaccine Information Sheet and instruction to access the V-Safe system.   [name] was instructed to call 911 with any severe reactions post vaccine: Marland Kitchen Difficulty breathing  . Swelling of your face and throat  . A fast heartbeat  . A bad rash all over your body  . Dizziness and weakness    Immunizations Administered    Name Date Dose VIS Date Route   Pfizer COVID-19 Vaccine 10/22/2019 12:17 PM 0.3 mL 09/19/2019 Intramuscular   Manufacturer: [employer], [employer]   Lot: [id number]   NDC: [address]-9

## 2019-11-11 ENCOUNTER — Ambulatory Visit: Payer: Medicare Other | Attending: Internal Medicine

## 2019-11-11 DIAGNOSIS — Z23 Encounter for immunization: Secondary | ICD-10-CM

## 2019-11-11 NOTE — Progress Notes (Signed)
   Covid-19 Vaccination Clinic  Name:  DAWID DUPRIEST    MRN: [id number] DOB: [birth year]  11/11/2019  Mr. Weltman was observed post Covid-19 immunization for 15 minutes without incidence. He was provided with Vaccine Information Sheet and instruction to access the V-Safe system.   Mr. Peavy was instructed to call 911 with any severe reactions post vaccine: Marland Kitchen Difficulty breathing  . Swelling of your face and throat  . A fast heartbeat  . A bad rash all over your body  . Dizziness and weakness    Immunizations Administered    Name Date Dose VIS Date Route   Pfizer COVID-19 Vaccine 11/11/2019 10:57 AM 0.3 mL 09/19/2019 Intramuscular   Manufacturer: [employer], [employer]   Lot: [id number]   NDC: [address]-9

## 2019-11-26 ENCOUNTER — Ambulatory Visit: Payer: Medicare Other | Admitting: Internal Medicine

## 2019-11-26 ENCOUNTER — Other Ambulatory Visit: Payer: Self-pay

## 2019-11-26 ENCOUNTER — Encounter: Payer: Self-pay | Admitting: Internal Medicine

## 2019-11-26 VITALS — BP 136/76 | HR 70 | Ht 72.0 in | Wt 203.8 lb

## 2019-11-26 DIAGNOSIS — I495 Sick sinus syndrome: Secondary | ICD-10-CM | POA: Diagnosis not present

## 2019-11-26 DIAGNOSIS — Z950 Presence of cardiac pacemaker: Secondary | ICD-10-CM | POA: Diagnosis not present

## 2019-11-26 DIAGNOSIS — I442 Atrioventricular block, complete: Secondary | ICD-10-CM

## 2019-11-26 NOTE — Progress Notes (Signed)
  HPI  Luke Schmidt is a 79 y.o. male Seen in followup for pacemaker implantation years ago for complete heart block. He  underwent generator replacement 2010.   He  just unrelated the most unfortunate news to me that he is a [facility] football fan and travels the country watching football.:))  Also a  Licensed conveyancer  The patient denies chest pain, shortness of breath, nocturnal dyspnea, orthopnea or peripheral edema.  There have been no palpitations, lightheadedness or syncope.       Past Medical History:  Diagnosis Date  . Complete heart block (HCC)   . HTN (hypertension)   . Hypertriglyceridemia   . Pacemaker    Medtronic adapta  L; generator replacement 2010  . Sinus node dysfunction [facility])     Past Surgical History:  Procedure Laterality Date  . PACEMAKER INSERTION     Medtronic     Current Outpatient Medications  Medication Sig Dispense Refill  . amLODipine (NORVASC) 10 MG tablet Take 1 tablet (10 mg total) by mouth daily. 30 tablet 11  . atorvastatin (LIPITOR) 10 MG tablet Take 10 mg by mouth daily.     . cholecalciferol (VITAMIN D) 1000 UNITS tablet Take 1,000 Units by mouth daily.    Marland Kitchen KLOR-CON 10 10 MEQ tablet Take 10 mEq by mouth daily.     Marland Kitchen losartan-hydrochlorothiazide (HYZAAR) 50-12.5 MG per tablet Take 1 tablet by mouth daily.       No current facility-administered medications for this visit.    Allergies  Allergen Reactions  . Penicillins     Hives     Review of Systems negative except from HPI and PMH  Physical Exam BP 136/76   Pulse 70   Ht 6' (1.829 m)   Wt 203 lb 12.8 oz (92.4 kg)   SpO2 97%   BMI 27.64 kg/m  Well developed and well nourished in no acute distress HENT normal Neck supple with JVP-flat Clear Device pocket well healed; without hematoma or erythema.  There is no tethering  Regular rate and rhythm, no   murmur Abd-soft with active BS No Clubbing cyanosis edema Skin-warm and dry A & Oriented  Grossly normal sensory  and motor function  ECG AV pacing at 70  Assessment and  Plan  Complete heart block  Sinus node dysfunction  Pacemaker-Medtronic  hypertension   BP well controlled  Normal heart rate excursion

## 2019-11-26 NOTE — Patient Instructions (Signed)

## 2019-12-08 LAB — CUP PACEART INCLINIC DEVICE CHECK
Battery Impedance: 2824 Ohm
Battery Remaining Longevity: 21 mo
Battery Voltage: 2.71 V
Brady Statistic AP VP Percent: 100 %
Brady Statistic AP VS Percent: 0 %
Brady Statistic AS VP Percent: 0 %
Brady Statistic AS VS Percent: 0 %
Date Time Interrogation Session: 20210217160700
Implantable Lead Implant Date: 19980414
Implantable Lead Implant Date: 19980414
Implantable Lead Location: 753859
Implantable Lead Location: 753860
Implantable Pulse Generator Implant Date: 20100419
Lead Channel Impedance Value: 425 Ohm
Lead Channel Impedance Value: 764 Ohm
Lead Channel Pacing Threshold Amplitude: 0.5 V
Lead Channel Pacing Threshold Amplitude: 0.75 V
Lead Channel Pacing Threshold Pulse Width: 0.4 ms
Lead Channel Pacing Threshold Pulse Width: 0.46 ms
Lead Channel Setting Pacing Amplitude: 2 V
Lead Channel Setting Pacing Amplitude: 2.5 V
Lead Channel Setting Pacing Pulse Width: 0.46 ms
Lead Channel Setting Sensing Sensitivity: 2.8 mV

## 2019-12-26 ENCOUNTER — Ambulatory Visit (INDEPENDENT_AMBULATORY_CARE_PROVIDER_SITE_OTHER): Payer: Medicare Other | Admitting: *Deleted

## 2019-12-26 DIAGNOSIS — Z950 Presence of cardiac pacemaker: Secondary | ICD-10-CM | POA: Diagnosis not present

## 2019-12-26 LAB — CUP PACEART REMOTE DEVICE CHECK
Battery Impedance: 2907 Ohm
Battery Remaining Longevity: 21 mo
Battery Voltage: 2.73 V
Brady Statistic AP VP Percent: 100 %
Brady Statistic AP VS Percent: 0 %
Brady Statistic AS VP Percent: 0 %
Brady Statistic AS VS Percent: 0 %
Date Time Interrogation Session: 20210319110211
Implantable Lead Implant Date: 19980414
Implantable Lead Implant Date: 19980414
Implantable Lead Location: 753859
Implantable Lead Location: 753860
Implantable Pulse Generator Implant Date: 20100419
Lead Channel Impedance Value: 420 Ohm
Lead Channel Impedance Value: 825 Ohm
Lead Channel Pacing Threshold Amplitude: 0.375 V
Lead Channel Pacing Threshold Amplitude: 0.625 V
Lead Channel Pacing Threshold Pulse Width: 0.4 ms
Lead Channel Pacing Threshold Pulse Width: 0.4 ms
Lead Channel Setting Pacing Amplitude: 2 V
Lead Channel Setting Pacing Amplitude: 2.5 V
Lead Channel Setting Pacing Pulse Width: 0.4 ms
Lead Channel Setting Sensing Sensitivity: 2.8 mV

## 2019-12-26 NOTE — Progress Notes (Signed)
PPM Remote  

## 2020-04-07 ENCOUNTER — Ambulatory Visit (INDEPENDENT_AMBULATORY_CARE_PROVIDER_SITE_OTHER): Payer: Medicare Other | Admitting: *Deleted

## 2020-04-07 DIAGNOSIS — I442 Atrioventricular block, complete: Secondary | ICD-10-CM | POA: Diagnosis not present

## 2020-04-07 LAB — CUP PACEART REMOTE DEVICE CHECK
Battery Impedance: 3190 Ohm
Battery Remaining Longevity: 19 mo
Battery Voltage: 2.72 V
Brady Statistic AP VP Percent: 100 %
Brady Statistic AP VS Percent: 0 %
Brady Statistic AS VP Percent: 0 %
Brady Statistic AS VS Percent: 0 %
Date Time Interrogation Session: 20210629092418
Implantable Lead Implant Date: 19980414
Implantable Lead Implant Date: 19980414
Implantable Lead Location: 753859
Implantable Lead Location: 753860
Implantable Pulse Generator Implant Date: 20100419
Lead Channel Impedance Value: 443 Ohm
Lead Channel Impedance Value: 865 Ohm
Lead Channel Pacing Threshold Amplitude: 0.375 V
Lead Channel Pacing Threshold Amplitude: 0.625 V
Lead Channel Pacing Threshold Pulse Width: 0.4 ms
Lead Channel Pacing Threshold Pulse Width: 0.4 ms
Lead Channel Setting Pacing Amplitude: 2 V
Lead Channel Setting Pacing Amplitude: 2.5 V
Lead Channel Setting Pacing Pulse Width: 0.4 ms
Lead Channel Setting Sensing Sensitivity: 2.8 mV

## 2020-04-08 NOTE — Progress Notes (Signed)
Remote pacemaker transmission.   

## 2020-07-07 ENCOUNTER — Ambulatory Visit (INDEPENDENT_AMBULATORY_CARE_PROVIDER_SITE_OTHER): Payer: Medicare Other | Admitting: Emergency Medicine

## 2020-07-07 DIAGNOSIS — I442 Atrioventricular block, complete: Secondary | ICD-10-CM | POA: Diagnosis not present

## 2020-07-08 LAB — CUP PACEART REMOTE DEVICE CHECK
Battery Impedance: 3576 Ohm
Battery Remaining Longevity: 16 mo
Battery Voltage: 2.7 V
Brady Statistic AP VP Percent: 100 %
Brady Statistic AP VS Percent: 0 %
Brady Statistic AS VP Percent: 0 %
Brady Statistic AS VS Percent: 0 %
Date Time Interrogation Session: 20210929123640
Implantable Lead Implant Date: 19980414
Implantable Lead Implant Date: 19980414
Implantable Lead Location: 753859
Implantable Lead Location: 753860
Implantable Pulse Generator Implant Date: 20100419
Lead Channel Impedance Value: 449 Ohm
Lead Channel Impedance Value: 834 Ohm
Lead Channel Pacing Threshold Amplitude: 0.5 V
Lead Channel Pacing Threshold Amplitude: 0.625 V
Lead Channel Pacing Threshold Pulse Width: 0.4 ms
Lead Channel Pacing Threshold Pulse Width: 0.4 ms
Lead Channel Setting Pacing Amplitude: 2 V
Lead Channel Setting Pacing Amplitude: 2.5 V
Lead Channel Setting Pacing Pulse Width: 0.4 ms
Lead Channel Setting Sensing Sensitivity: 2.8 mV

## 2020-07-09 NOTE — Progress Notes (Signed)
Remote pacemaker transmission.   

## 2020-11-29 ENCOUNTER — Encounter: Payer: Medicare Other | Admitting: Internal Medicine

## 2020-12-01 ENCOUNTER — Other Ambulatory Visit: Payer: Self-pay

## 2020-12-01 ENCOUNTER — Encounter: Payer: Self-pay | Admitting: Internal Medicine

## 2020-12-01 ENCOUNTER — Ambulatory Visit: Payer: Medicare Other | Admitting: Internal Medicine

## 2020-12-01 VITALS — BP 126/80 | HR 70 | Ht 72.0 in | Wt 206.0 lb

## 2020-12-01 DIAGNOSIS — Z950 Presence of cardiac pacemaker: Secondary | ICD-10-CM

## 2020-12-01 DIAGNOSIS — I495 Sick sinus syndrome: Secondary | ICD-10-CM

## 2020-12-01 DIAGNOSIS — I442 Atrioventricular block, complete: Secondary | ICD-10-CM | POA: Diagnosis not present

## 2020-12-01 DIAGNOSIS — I429 Cardiomyopathy, unspecified: Secondary | ICD-10-CM

## 2020-12-01 NOTE — Patient Instructions (Addendum)
Medication Instructions:  Your physician recommends that you continue on your current medications as directed. Please refer to the Current Medication list given to you today.  *If you need a refill on your cardiac medications before your next appointment, please call your pharmacy*   Lab Work: None ordered.  If you have labs (blood work) drawn today and your tests are completely normal, you will receive your results only by: Marland Kitchen MyChart Message (if you have MyChart) OR . A paper copy in the mail If you have any lab test that is abnormal or we need to change your treatment, we will call you to review the results.   Testing/Procedures: Your physician has requested that you have an echocardiogram. Echocardiography is a painless test that uses sound waves to create images of your heart. It provides your doctor with information about the size and shape of your heart and how well your heart's chambers and valves are working. This procedure takes approximately one hour. There are no restrictions for this procedure.     Follow-Up: At [facility], you and your health needs are our priority.  As part of our continuing mission to provide you with exceptional heart care, we have created designated Provider Care Teams.  These Care Teams include your primary Cardiologist (physician) and Advanced Practice Providers (APPs -  Physician Assistants and Nurse Practitioners) who all work together to provide you with the care you need, when you need it.  We recommend signing up for the patient portal called "MyChart".  Sign up information is provided on this After Visit Summary.  MyChart is used to connect with patients for Virtual Visits (Telemedicine).  Patients are able to view lab/test results, encounter notes, upcoming appointments, etc.  Non-urgent messages can be sent to your provider as well.   To learn more about what you can do with MyChart, go to [email or web address].    Your next appointment:    9-10 month(s)  The format for your next appointment:   In Person  Provider:   [name], MD

## 2020-12-01 NOTE — Progress Notes (Signed)
  HPI  Luke Schmidt is a 80 y.o. male Seen in followup for pacemaker implantation years ago for complete heart block. He  underwent generator replacement 2010.   He  just unrelated the most unfortunate news to me that he is a [employer] football fan and travels the country watching football.:))  Also a  Licensed conveyancer  The patient denies chest pain, shortness of breath, nocturnal dyspnea, orthopnea or peripheral edema.  There have been no palpitations, lightheadedness or syncope  Interval cold >> COVID positive, he and his wife  .     DATE TEST EF   12/17 Echo   55-65 %           Past Medical History:  Diagnosis Date  . Complete heart block (HCC)   . HTN (hypertension)   . Hypertriglyceridemia   . Pacemaker    Medtronic adapta  L; generator replacement 2010  . Sinus node dysfunction [facility])     Past Surgical History:  Procedure Laterality Date  . PACEMAKER INSERTION     Medtronic     Current Outpatient Medications  Medication Sig Dispense Refill  . amLODipine (NORVASC) 10 MG tablet Take 1 tablet (10 mg total) by mouth daily. 30 tablet 11  . atorvastatin (LIPITOR) 10 MG tablet Take 10 mg by mouth daily.     . cholecalciferol (VITAMIN D) 1000 UNITS tablet Take 1,000 Units by mouth daily.    Marland Kitchen KLOR-CON 10 10 MEQ tablet Take 10 mEq by mouth daily.     Marland Kitchen losartan-hydrochlorothiazide (HYZAAR) 50-12.5 MG per tablet Take 1 tablet by mouth daily.       No current facility-administered medications for this visit.    Allergies  Allergen Reactions  . Penicillins     Hives     Review of Systems negative except from HPI and PMH  Physical Exam BP 126/80   Pulse 70   Ht 6' (1.829 m)   Wt 206 lb (93.4 kg)   SpO2 98%   BMI 27.94 kg/m  Well developed and well nourished in no acute distress HENT normal Neck supple with JVP-flat Clear Device pocket well healed; without hematoma or erythema.  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

## 2020-12-02 NOTE — Addendum Note (Signed)
Addended by: Cleda Mccreedy on: 12/02/2020 01:47 PM   Modules accepted: Orders

## 2020-12-27 ENCOUNTER — Other Ambulatory Visit (HOSPITAL_COMMUNITY): Payer: Medicare Other

## 2021-01-13 ENCOUNTER — Ambulatory Visit (INDEPENDENT_AMBULATORY_CARE_PROVIDER_SITE_OTHER): Payer: Medicare Other

## 2021-01-13 DIAGNOSIS — I442 Atrioventricular block, complete: Secondary | ICD-10-CM

## 2021-01-13 LAB — CUP PACEART REMOTE DEVICE CHECK
Battery Impedance: 4986 Ohm
Battery Remaining Longevity: 7 mo
Battery Voltage: 2.65 V
Brady Statistic AP VP Percent: 100 %
Brady Statistic AP VS Percent: 0 %
Brady Statistic AS VP Percent: 0 %
Brady Statistic AS VS Percent: 0 %
Date Time Interrogation Session: 20220407105421
Implantable Lead Implant Date: 19980414
Implantable Lead Implant Date: 19980414
Implantable Lead Location: 753859
Implantable Lead Location: 753860
Implantable Pulse Generator Implant Date: 20100419
Lead Channel Impedance Value: 424 Ohm
Lead Channel Impedance Value: 846 Ohm
Lead Channel Pacing Threshold Amplitude: 0.5 V
Lead Channel Pacing Threshold Amplitude: 0.625 V
Lead Channel Pacing Threshold Pulse Width: 0.4 ms
Lead Channel Pacing Threshold Pulse Width: 0.4 ms
Lead Channel Setting Pacing Amplitude: 2 V
Lead Channel Setting Pacing Amplitude: 2.5 V
Lead Channel Setting Pacing Pulse Width: 0.4 ms
Lead Channel Setting Sensing Sensitivity: 2.8 mV

## 2021-01-25 NOTE — Progress Notes (Signed)
Remote pacemaker transmission.   

## 2021-02-14 ENCOUNTER — Ambulatory Visit (INDEPENDENT_AMBULATORY_CARE_PROVIDER_SITE_OTHER): Payer: Medicare Other

## 2021-02-14 DIAGNOSIS — I442 Atrioventricular block, complete: Secondary | ICD-10-CM

## 2021-02-15 LAB — CUP PACEART REMOTE DEVICE CHECK
Battery Impedance: 5813 Ohm
Battery Remaining Longevity: 3 mo
Battery Voltage: 2.63 V
Brady Statistic AP VP Percent: 100 %
Brady Statistic AP VS Percent: 0 %
Brady Statistic AS VP Percent: 0 %
Brady Statistic AS VS Percent: 0 %
Date Time Interrogation Session: 20220510143034
Implantable Lead Implant Date: 19980414
Implantable Lead Implant Date: 19980414
Implantable Lead Location: 753859
Implantable Lead Location: 753860
Implantable Pulse Generator Implant Date: 20100419
Lead Channel Impedance Value: 436 Ohm
Lead Channel Impedance Value: 795 Ohm
Lead Channel Pacing Threshold Amplitude: 0.5 V
Lead Channel Pacing Threshold Amplitude: 0.625 V
Lead Channel Pacing Threshold Pulse Width: 0.4 ms
Lead Channel Pacing Threshold Pulse Width: 0.4 ms
Lead Channel Setting Pacing Amplitude: 2 V
Lead Channel Setting Pacing Amplitude: 2.5 V
Lead Channel Setting Pacing Pulse Width: 0.46 ms
Lead Channel Setting Sensing Sensitivity: 2.8 mV

## 2021-02-28 ENCOUNTER — Other Ambulatory Visit: Payer: Self-pay

## 2021-02-28 ENCOUNTER — Ambulatory Visit (HOSPITAL_COMMUNITY): Payer: Medicare Other | Attending: Cardiovascular Disease

## 2021-02-28 DIAGNOSIS — I428 Other cardiomyopathies: Secondary | ICD-10-CM | POA: Diagnosis not present

## 2021-02-28 DIAGNOSIS — E785 Hyperlipidemia, unspecified: Secondary | ICD-10-CM | POA: Insufficient documentation

## 2021-02-28 DIAGNOSIS — Z950 Presence of cardiac pacemaker: Secondary | ICD-10-CM | POA: Insufficient documentation

## 2021-02-28 DIAGNOSIS — R5383 Other fatigue: Secondary | ICD-10-CM | POA: Insufficient documentation

## 2021-02-28 DIAGNOSIS — I429 Cardiomyopathy, unspecified: Secondary | ICD-10-CM | POA: Insufficient documentation

## 2021-02-28 DIAGNOSIS — I10 Essential (primary) hypertension: Secondary | ICD-10-CM | POA: Diagnosis not present

## 2021-02-28 DIAGNOSIS — I442 Atrioventricular block, complete: Secondary | ICD-10-CM | POA: Diagnosis not present

## 2021-02-28 LAB — ECHOCARDIOGRAM COMPLETE
Area-P 1/2: 1.71 cm2
S' Lateral: 3.8 cm

## 2021-03-03 NOTE — Progress Notes (Signed)
Remote pacemaker transmission.   

## 2021-03-03 NOTE — Addendum Note (Signed)
Addended by: Elease Etienne A on: 03/03/2021 09:22 AM   Modules accepted: Level of Service

## 2021-03-17 ENCOUNTER — Other Ambulatory Visit: Payer: Self-pay

## 2021-04-12 ENCOUNTER — Telehealth: Payer: Self-pay

## 2021-04-12 NOTE — Telephone Encounter (Signed)
Successful telephone encounter to [place]. [place] after carelink alert received for PPM battery at RRT. Pacing in back up mode of VVI 65. Patient denies s/s of shortness of breath however he does state he is more tired than "normal" and unable to do task around the house with ease. Gen change out discussed in office note 12/01/20. Will send to Dr. Posey Boyer for gen change scheduling.

## 2021-04-13 ENCOUNTER — Telehealth: Payer: Self-pay

## 2021-04-13 NOTE — Telephone Encounter (Signed)
The patient called with questions on what he can and can not do? I let him know the scheduler to get him scheduled. I let him speak with Portia, rn.

## 2021-04-13 NOTE — Telephone Encounter (Signed)
Spoke to patient regarding next steps for gen change. Reassured patient that he would be contacted by scheduler for appointment to discuss gen change, labs, and EKG. Patient appreciative of the reassurance. He also enquired about activities that he should and should not do while waiting on gen change and at back up VVI pacing. Encouraged patient to listen to his body, rest when he needed to rest and if an activity provided to be too tasking, stop the activity. Patient verbalized understanding

## 2021-04-14 ENCOUNTER — Ambulatory Visit (INDEPENDENT_AMBULATORY_CARE_PROVIDER_SITE_OTHER): Payer: Medicare Other

## 2021-04-14 DIAGNOSIS — I495 Sick sinus syndrome: Secondary | ICD-10-CM | POA: Diagnosis not present

## 2021-04-17 LAB — CUP PACEART REMOTE DEVICE CHECK
Battery Impedance: 7311 Ohm
Battery Voltage: 2.63 V
Brady Statistic RV Percent Paced: 100 %
Date Time Interrogation Session: 20220708141443
Implantable Lead Implant Date: 19980414
Implantable Lead Implant Date: 19980414
Implantable Lead Location: 753859
Implantable Lead Location: 753860
Implantable Pulse Generator Implant Date: 20100419
Lead Channel Impedance Value: 67 Ohm
Lead Channel Impedance Value: 691 Ohm
Lead Channel Setting Pacing Amplitude: 2.5 V
Lead Channel Setting Pacing Pulse Width: 0.4 ms
Lead Channel Setting Sensing Sensitivity: 2.8 mV

## 2021-05-04 NOTE — H&P (View-Only) (Signed)
Electrophysiology Office Note Date: 05/05/2021  ID:  Luke Schmidt, DOB 01-15-1941, MRN [id number]  PCP: Luke Orn, MD Primary Cardiologist: Luke Axe, MD Electrophysiologist: Luke Axe, MD   CC: Pacemaker follow-up  Luke Schmidt is a 80 y.o. male seen today for Luke Axe, MD for routine electrophysiology followup.  Since last being seen in our clinic the patient reports doing OK. He has noticed decreased functional capacity since device met ERI and reverted to VVI 65.  he denies chest pain, palpitations, PND, orthopnea, nausea, vomiting, dizziness, syncope, edema, weight gain, or early satiety.  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 30 tablet 11   atorvastatin (LIPITOR) 10 MG tablet Take 10 mg by mouth daily.      cholecalciferol (VITAMIN D) 1000 UNITS tablet Take 1,000 Units by mouth daily.     KLOR-CON 10 10 MEQ tablet Take 10 mEq by mouth daily.      losartan-hydrochlorothiazide (HYZAAR) 50-12.5 MG per tablet Take 1 tablet by mouth daily.       No current facility-administered medications for this visit.    Allergies:   Penicillins   Social History: Social History   Socioeconomic History   Marital status: Married    Spouse name: Not on file   Number of children: Not on file   Years of education: Not on file   Highest education level: Not on file  Occupational History   Not on file  Tobacco Use   Smoking status: Never   Smokeless tobacco: Never   Vaping Use   Vaping Use: Never used  Substance and Sexual Activity   Alcohol use: Yes    Comment: 3 x week   Drug use: No   Sexual activity: Not on file  Other Topics Concern   Not on file  Social History Narrative   Not on file   Social Determinants of Health   Financial Resource Strain: Not on file  Food Insecurity: Not on file  Transportation Needs: Not on file  Physical Activity: Not on file  Stress: Not on file  Social Connections: Not on file  Intimate Partner Violence: Not on file    Family History: History reviewed. No pertinent family history.   Review of Systems: All other systems reviewed and are otherwise negative except as noted above.  Physical Exam: Vitals:   05/05/21 1156  BP: 112/62  Pulse: 65  SpO2: 97%  Weight: 204 lb 3.2 oz (92.6 kg)  Height: 6' (1.829 m)     GEN- The patient is well appearing, alert and oriented x 3 today.   HEENT: normocephalic, atraumatic; sclera clear, conjunctiva pink; hearing intact; oropharynx clear; neck supple  Lungs- Clear to ausculation bilaterally, normal work of breathing.  No wheezes, rales, rhonchi Heart- Regular rate and rhythm, no murmurs, rubs or gallops  GI- soft, non-tender, non-distended, bowel sounds present  Extremities- no clubbing or cyanosis. No edema MS- no significant deformity or atrophy  Skin- warm and dry, no rash or lesion; PPM pocket well healed Psych- euthymic mood, full affect Neuro- strength and sensation are intact  PPM Interrogation- reviewed in detail today,  See PACEART report  EKG:  EKG is ordered today. The ekg ordered today shows V pacing at 65 bpm  Recent Labs: No results found for requested labs within last 8760 hours.   Wt Readings from Last 3 Encounters:  05/05/21 204 lb 3.2 oz (92.6 kg)  12/01/20 206 lb (93.4 kg)  11/26/19 203 lb 12.8 oz (92.4 kg)     Other studies Reviewed: Additional studies/ records that were reviewed today include: Previous EP office notes,  Previous remote checks, Most recent labwork.   Assessment and Plan:  1. CHB s/p Medtronic PPM  Device at ERI. Adapta has no episodes or testing allowed once ERI met. Stuck at VVI 65. 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  2. HTN Continue current medications  3. Decreased functional capacity Most likely with loss of AV synchrony with device at ERI.  Recent echo with normal EF as above.   Current medicines are reviewed at length with the patient today.   The patient does not have concerns regarding his medicines.  The following changes were made today:  none  Labs/ tests ordered today include:  No orders of the defined types were placed in this encounter.  Disposition:   Follow up with Dr. Caryl Comes as usual post gen change.    Jacalyn Lefevre, PA-C  05/05/2021 12:21 PM  [address] [address] [phone] (office) [phone] (fax)

## 2021-05-04 NOTE — Progress Notes (Signed)
Electrophysiology Office Note Date: 05/05/2021  ID:  Luke Schmidt, DOB 01-15-1941, MRN [id number]  PCP: Lavone Orn, MD Primary Cardiologist: Luke Axe, MD Electrophysiologist: Luke Axe, MD   CC: Pacemaker follow-up  Luke Schmidt is a 80 y.o. male seen today for Luke Axe, MD for routine electrophysiology followup.  Since last being seen in our clinic the patient reports doing OK. He has noticed decreased functional capacity since device met ERI and reverted to VVI 65.  he denies chest pain, palpitations, PND, orthopnea, nausea, vomiting, dizziness, syncope, edema, weight gain, or early satiety.  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 30 tablet 11   atorvastatin (LIPITOR) 10 MG tablet Take 10 mg by mouth daily.      cholecalciferol (VITAMIN D) 1000 UNITS tablet Take 1,000 Units by mouth daily.     KLOR-CON 10 10 MEQ tablet Take 10 mEq by mouth daily.      losartan-hydrochlorothiazide (HYZAAR) 50-12.5 MG per tablet Take 1 tablet by mouth daily.       No current facility-administered medications for this visit.    Allergies:   Penicillins   Social History: Social History   Socioeconomic History   Marital status: Married    Spouse name: Not on file   Number of children: Not on file   Years of education: Not on file   Highest education level: Not on file  Occupational History   Not on file  Tobacco Use   Smoking status: Never   Smokeless tobacco: Never   Vaping Use   Vaping Use: Never used  Substance and Sexual Activity   Alcohol use: Yes    Comment: 3 x week   Drug use: No   Sexual activity: Not on file  Other Topics Concern   Not on file  Social History Narrative   Not on file   Social Determinants of Health   Financial Resource Strain: Not on file  Food Insecurity: Not on file  Transportation Needs: Not on file  Physical Activity: Not on file  Stress: Not on file  Social Connections: Not on file  Intimate Partner Violence: Not on file    Family History: History reviewed. No pertinent family history.   Review of Systems: All other systems reviewed and are otherwise negative except as noted above.  Physical Exam: Vitals:   05/05/21 1156  BP: 112/62  Pulse: 65  SpO2: 97%  Weight: 204 lb 3.2 oz (92.6 kg)  Height: 6' (1.829 m)     GEN- The patient is well appearing, alert and oriented x 3 today.   HEENT: normocephalic, atraumatic; sclera clear, conjunctiva pink; hearing intact; oropharynx clear; neck supple  Lungs- Clear to ausculation bilaterally, normal work of breathing.  No wheezes, rales, rhonchi Heart- Regular rate and rhythm, no murmurs, rubs or gallops  GI- soft, non-tender, non-distended, bowel sounds present  Extremities- no clubbing or cyanosis. No edema MS- no significant deformity or atrophy  Skin- warm and dry, no rash or lesion; PPM pocket well healed Psych- euthymic mood, full affect Neuro- strength and sensation are intact  PPM Interrogation- reviewed in detail today,  See PACEART report  EKG:  EKG is ordered today. The ekg ordered today shows V pacing at 65 bpm  Recent Labs: No results found for requested labs within last 8760 hours.   Wt Readings from Last 3 Encounters:  05/05/21 204 lb 3.2 oz (92.6 kg)  12/01/20 206 lb (93.4 kg)  11/26/19 203 lb 12.8 oz (92.4 kg)     Other studies Reviewed: Additional studies/ records that were reviewed today include: Previous EP office notes,  Previous remote checks, Most recent labwork.   Assessment and Plan:  1. CHB s/p Medtronic PPM  Device at ERI. Adapta has no episodes or testing allowed once ERI met. Stuck at VVI 65. 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  2. HTN Continue current medications  3. Decreased functional capacity Most likely with loss of AV synchrony with device at ERI.  Recent echo with normal EF as above.   Current medicines are reviewed at length with the patient today.   The patient does not have concerns regarding his medicines.  The following changes were made today:  none  Labs/ tests ordered today include:  No orders of the defined types were placed in this encounter.  Disposition:   Follow up with Dr. Caryl Schmidt as usual post gen change.    Jacalyn Lefevre, PA-C  05/05/2021 12:21 PM  [place] [address] [phone] (office) [phone] (fax)

## 2021-05-05 ENCOUNTER — Encounter: Payer: Self-pay | Admitting: Student

## 2021-05-05 ENCOUNTER — Ambulatory Visit: Payer: Medicare Other | Admitting: Student

## 2021-05-05 ENCOUNTER — Other Ambulatory Visit: Payer: Self-pay

## 2021-05-05 VITALS — BP 112/62 | HR 65 | Ht 72.0 in | Wt 204.2 lb

## 2021-05-05 DIAGNOSIS — I495 Sick sinus syndrome: Secondary | ICD-10-CM

## 2021-05-05 DIAGNOSIS — I442 Atrioventricular block, complete: Secondary | ICD-10-CM | POA: Diagnosis not present

## 2021-05-05 DIAGNOSIS — I10 Essential (primary) hypertension: Secondary | ICD-10-CM

## 2021-05-05 LAB — CUP PACEART INCLINIC DEVICE CHECK
Date Time Interrogation Session: 20220728122149
Implantable Lead Implant Date: 19980414
Implantable Lead Implant Date: 19980414
Implantable Lead Location: 753859
Implantable Lead Location: 753860
Implantable Pulse Generator Implant Date: 20100419

## 2021-05-05 NOTE — Patient Instructions (Signed)
Medication Instructions: Your physician recommends that you continue on your current medications as directed. Please refer to the Current Medication list given to you today.  Labwork: Your physician has recommended that you have lab work today: BMET and CBC   Procedures/Testing: Your physician has recommended that you have a Generator Change of your device. This is a procedure that replaces a Pacemaker ICD generator that is at the end of its service life. The remaining lifespan of a pacemaker is determined during visits to the Device Clinic. The battery in a pacemaker does not stop suddenly but rather loses its charge slowly, which lets the cardiologist plan the replacement date.  Follow-Up: Your physician recommends that you schedule a follow-up appointment in 10 - 14 days from 05/25/2021 with the Device clinic for a wound check  Your physician recommends that you schedule a follow-up appointment in 3 months from 05/25/2021 with Dr. Graciela Husbands.   If you need a refill on your cardiac medications before your next appointment, please call your pharmacy.   -------------------------------------------------------------------------------------------------------------  Please wash with the CHG Soap the night before and morning of procedure (follow instruction page "Preparing For Surgery").   Please report to the [facility] Main Entrance "A"of [facility] at 10:30am on 05/25/2021 [facility] [place], [place] [address])  DO NOT eat or drink anything after midnight the night before procedure  ONLY take Amlodipine the morning of your procedure  You will need someone to drive you home after the procedure  -------------------------------------------------------------------------------------------------------------- [facility] - Preparing For Surgery  Before surgery, you can play an important role. Because skin is not sterile, your skin needs to be as free of germs as  possible. You can reduce the number of germs on your skin by washing with CHG (chlorahexidine gluconate) Soap before surgery.  CHG is an antiseptic cleaner which kills germs and bonds with the skin to continue killing germs even after washing.   Please do not use if you have an allergy to CHG or antibacterial soaps.  If your skin becomes reddened/irritated stop using the CHG.   Do not shave (including legs and underarms) for at least 48 hours prior to first CHG shower.  It is OK to shave your face.  Please follow these instructions carefully:  1.  Shower the night before surgery and the morning of surgery with CHG.  2.  If you choose to wash your hair, wash your hair first as usual with your normal shampoo.  3.  After you shampoo, rinse your hair and body thoroughly to remove the shampoo.  4.  Use CHG as you would any other liquid soap.  You can apply CHG directly to the skin and wash gently with a clean washcloth. 5.  Apply the CHG Soap to your body ONLY FROM THE NECK DOWN.  Do not use on open wounds or open sores.  Avoid contact with your eyes, ears, mouth and genitals (private parts).  Wash genitals (private parts) with your normal soap.  6.  Wash thoroughly, paying special attention to the area where your surgery will be performed.  7.  Thoroughly rinse your body with warm water from the neck down.   8.  DO NOT shower/wash with your normal soap after using and rinsing off the CHG soap.  9.  Pat yourself dry with a clean towel.           10.  Wear clean pajamas.  11.  Place clean sheets on your bed the night of your first shower and do not sleep with pets.  Day of Surgery: Do not apply any deodorants/lotions.  Please wear clean clothes to the hospital/surgery center.

## 2021-05-05 NOTE — Progress Notes (Signed)
Remote pacemaker transmission.   

## 2021-05-06 LAB — BASIC METABOLIC PANEL
BUN/Creatinine Ratio: 15 (ref 10–24)
BUN: 20 mg/dL (ref 8–27)
CO2: 27 mmol/L (ref 20–29)
Calcium: 9.4 mg/dL (ref 8.6–10.2)
Chloride: 100 mmol/L (ref 96–106)
Creatinine, Ser: 1.36 mg/dL — ABNORMAL HIGH (ref 0.76–1.27)
Glucose: 110 mg/dL — ABNORMAL HIGH (ref 65–99)
Potassium: 3.8 mmol/L (ref 3.5–5.2)
Sodium: 142 mmol/L (ref 134–144)
eGFR: 52 mL/min/{1.73_m2} — ABNORMAL LOW (ref >59)

## 2021-05-06 LAB — CBC
Hematocrit: 43.1 % (ref 37.5–51.0)
Hemoglobin: 14.4 g/dL (ref 13.0–17.7)
MCH: 29.1 pg (ref 26.6–33.0)
MCHC: 33.4 g/dL (ref 31.5–35.7)
MCV: 87 fL (ref 79–97)
Platelets: 251 10*3/uL (ref 150–450)
RBC: 4.94 x10E6/uL (ref 4.14–5.80)
RDW: 13.2 % (ref 11.6–15.4)
WBC: 4.7 10*3/uL (ref 3.4–10.8)

## 2021-05-19 ENCOUNTER — Ambulatory Visit (INDEPENDENT_AMBULATORY_CARE_PROVIDER_SITE_OTHER): Payer: Medicare Other

## 2021-05-19 DIAGNOSIS — I442 Atrioventricular block, complete: Secondary | ICD-10-CM

## 2021-05-20 ENCOUNTER — Telehealth: Payer: Self-pay

## 2021-05-20 NOTE — Telephone Encounter (Signed)
LVM with pt regarding his Gen change procedure time. Arrival time has changed from 10:30am to 2:00pm for a 4:00pm procedure time. He was instructed he could have a light breakfast prior to 8am if he desired.   Will copy Dr. Odessa Fleming RN for review and follow up if needed.

## 2021-05-22 LAB — CUP PACEART REMOTE DEVICE CHECK
Battery Impedance: 8468 Ohm
Battery Voltage: 2.62 V
Brady Statistic RV Percent Paced: 100 %
Date Time Interrogation Session: 20220812105926
Implantable Lead Implant Date: 19980414
Implantable Lead Implant Date: 19980414
Implantable Lead Location: 753859
Implantable Lead Location: 753860
Implantable Pulse Generator Implant Date: 20100419
Lead Channel Impedance Value: 67 Ohm
Lead Channel Impedance Value: 827 Ohm
Lead Channel Setting Pacing Amplitude: 2.5 V
Lead Channel Setting Pacing Pulse Width: 0.4 ms
Lead Channel Setting Sensing Sensitivity: 2.8 mV

## 2021-05-24 MED ORDER — SODIUM CHLORIDE 0.9 % IV SOLN
80.0000 mg | INTRAVENOUS | Status: DC
  Filled 2021-05-24 (×2): qty 2

## 2021-05-24 NOTE — Telephone Encounter (Signed)
Spoke with pt and reiterated time change for pacemaker generator change.  Pt states he is aware of arrival time.  No further questions at this time.

## 2021-05-24 NOTE — Pre-Procedure Instructions (Signed)
Instructed patient on the following items: Arrival time 1400 Lite breakfast then nothing to eat  No meds AM of procedure Responsible person to drive you home and stay with you for 24 hrs Wash with special soap night before and morning of procedure

## 2021-05-25 ENCOUNTER — Other Ambulatory Visit: Payer: Self-pay

## 2021-05-25 ENCOUNTER — Encounter (HOSPITAL_COMMUNITY): Payer: Self-pay | Admitting: Internal Medicine

## 2021-05-25 ENCOUNTER — Ambulatory Visit (HOSPITAL_COMMUNITY)
Admission: RE | Admit: 2021-05-25 | Discharge: 2021-05-25 | Disposition: A | Discharge status: Home / Self Care | Payer: Medicare Other | Attending: Internal Medicine | Admitting: Internal Medicine

## 2021-05-25 ENCOUNTER — Ambulatory Visit (HOSPITAL_COMMUNITY)
Admission: RE | Disposition: A | Discharge status: Home / Self Care | Payer: Self-pay | Source: Home / Self Care | Attending: Internal Medicine

## 2021-05-25 DIAGNOSIS — Z880 Allergy status to penicillin: Secondary | ICD-10-CM | POA: Insufficient documentation

## 2021-05-25 DIAGNOSIS — Z45010 Encounter for checking and testing of cardiac pacemaker pulse generator [battery]: Secondary | ICD-10-CM | POA: Insufficient documentation

## 2021-05-25 DIAGNOSIS — I442 Atrioventricular block, complete: Secondary | ICD-10-CM

## 2021-05-25 DIAGNOSIS — I10 Essential (primary) hypertension: Secondary | ICD-10-CM | POA: Diagnosis not present

## 2021-05-25 DIAGNOSIS — I495 Sick sinus syndrome: Secondary | ICD-10-CM | POA: Insufficient documentation

## 2021-05-25 DIAGNOSIS — Z79899 Other long term (current) drug therapy: Secondary | ICD-10-CM | POA: Diagnosis not present

## 2021-05-25 HISTORY — PX: PPM GENERATOR CHANGEOUT: EP1233

## 2021-05-25 SURGERY — PPM GENERATOR CHANGEOUT

## 2021-05-25 MED ORDER — SODIUM CHLORIDE 0.9 % IV SOLN: INTRAVENOUS | Status: DC

## 2021-05-25 MED ORDER — VANCOMYCIN HCL IN DEXTROSE 1-5 GM/200ML-% IV SOLN
1000.0000 mg | INTRAVENOUS | Status: AC
  Administered 2021-05-25: 1000 mg via INTRAVENOUS

## 2021-05-25 MED ORDER — LIDOCAINE HCL 1 % IJ SOLN
INTRAMUSCULAR | Status: AC
  Filled 2021-05-25: qty 60

## 2021-05-25 MED ORDER — VANCOMYCIN HCL IN DEXTROSE 1-5 GM/200ML-% IV SOLN
INTRAVENOUS | Status: AC
  Filled 2021-05-25: qty 200

## 2021-05-25 MED ORDER — ACETAMINOPHEN 325 MG PO TABS: 325.0000 mg | ORAL_TABLET | ORAL | Status: DC | PRN

## 2021-05-25 MED ORDER — LIDOCAINE HCL (PF) 1 % IJ SOLN
INTRAMUSCULAR | Status: DC | PRN
  Administered 2021-05-25: 50 mL

## 2021-05-25 MED ORDER — CHLORHEXIDINE GLUCONATE 4 % EX LIQD
4.0000 "application " | Freq: Once | CUTANEOUS | Status: DC
  Filled 2021-05-25: qty 60

## 2021-05-25 MED ORDER — SODIUM CHLORIDE 0.9 % IV SOLN
INTRAVENOUS | Status: AC
  Filled 2021-05-25: qty 2

## 2021-05-25 SURGICAL SUPPLY — 8 items
CABLE SURGICAL S-101-97-12 (CABLE) ×2 IMPLANT
DEVICE DISSECT PLASMABLAD 3.0S (MISCELLANEOUS) ×1 IMPLANT
HEMOSTAT SURGICEL 2X4 FIBR (HEMOSTASIS) ×2 IMPLANT
IPG PACE AZUR XT DR MRI W1DR01 (Pacemaker) ×1 IMPLANT
PACE AZURE XT DR MRI W1DR01 (Pacemaker) ×2 IMPLANT
PLASMABLADE 3.0S (MISCELLANEOUS) ×2
POUCH AIGIS-R ANTIBACT PPM (Mesh General) ×2 IMPLANT
TRAY PACEMAKER INSERTION (PACKS) ×2 IMPLANT

## 2021-05-25 NOTE — Discharge Instructions (Signed)

## 2021-05-25 NOTE — Interval H&P Note (Signed)
History and Physical Interval Note:  05/25/2021 3:21 PM  Luke Schmidt  has presented today for surgery, with the diagnosis of ERI.  The various methods of treatment have been discussed with the patient and family. After consideration of risks, benefits and other options for treatment, the patient has consented to  Procedure(s): PPM GENERATOR CHANGEOUT (N/A) as a surgical intervention.  The patient's history has been reviewed, patient examined, no change in status, stable for surgery.  I have reviewed the patient's chart and labs.  Questions were answered to the patient's satisfaction.     [name]  Asinus node dysfunction  Complete heart block   Medtronic pacemaker at ERI \ EF  50-55%  5/22  BP 139/80   Pulse 85   Temp 98.2 F (36.8 C)   Resp 18   Ht 6' (1.829 m)   Wt 89.8 kg   SpO2 99%   BMI 26.85 kg/m  Well developed and nourished in no acute distress HENT normal Neck supple with JVP-  flat   Clear Regular rate and rhythm, no murmurs or gallops Device pocket well healed; without hematoma or erythema.  There is no tethering  Abd-soft with active BS No Clubbing cyanosis edema Skin-warm and dry A & Oriented  Grossly normal sensory and motor function      Pt  for pacemaker generator replacement  As this is 3rd procedure will use with antimicrobial pouch.   We have reviewed the benefits and risks of generator replacement.  These include but are not limited to lead fracture and infection.  The patient understands, agrees and is willing to proceed.

## 2021-05-26 MED FILL — Gentamicin Sulfate Inj 40 MG/ML: INTRAMUSCULAR | Qty: 80 | Status: AC

## 2021-05-26 MED FILL — Lidocaine HCl Local Inj 1%: INTRAMUSCULAR | Qty: 50 | Status: AC

## 2021-05-30 ENCOUNTER — Telehealth: Payer: Self-pay | Admitting: Internal Medicine

## 2021-05-30 NOTE — Telephone Encounter (Signed)
Pt this this Via mychart message to the sching pool:  Appointment Request From: Otelia Limes   With Provider: [name], MD [facility] [address] Office]   Preferred Date Range: Any date 05/30/2021 or later   Preferred Times: Any Time   Reason for visit: Office Visit   Comments: There is more swelling at the pacemaker site than I would have expected.  I may be normal but I would like someone to check it.

## 2021-05-30 NOTE — Telephone Encounter (Signed)
Returning patients phone call. Patient is concerned about increased amount of swelling. States he does have "pink" noted on his skin. Denies drainage/fever or chills. Advised patient I think its best he come into the device clinic to take a look at his wound considering recent gen change and patient states he has not had this much swelling as the previous 2 gen changes. Device clinic apt. Made 05/31/21 @ 9:00 am. Location, date and time discussed with patient with verbal understanding.

## 2021-05-31 ENCOUNTER — Other Ambulatory Visit: Payer: Self-pay

## 2021-05-31 ENCOUNTER — Ambulatory Visit (INDEPENDENT_AMBULATORY_CARE_PROVIDER_SITE_OTHER): Payer: Medicare Other

## 2021-05-31 DIAGNOSIS — I442 Atrioventricular block, complete: Secondary | ICD-10-CM

## 2021-05-31 NOTE — Patient Instructions (Signed)
Please leave pressure dressing in place until you see A. Tillery, PA on Friday. If you have any additional questions or concerns please call the device clinic at [phone].

## 2021-05-31 NOTE — Progress Notes (Addendum)
Patient presents to device clinic as scheduled for wound check secondary to c/o swelling post PPM gen change 05/25/21. Moderate sized soft hematoma present. Medications reviewed and patient is not on anyt anticoagulant medication. Dermabond remains, keeping incisional edges approximated and intact. Rash which appears to be from surgical scrub also noted. Carollee Herter, PA consulted. Wound assessed by PA and orders given to apply pressure dressing. Patient will return Friday, August 26 at 9:40 am for follow up appointment/wound check with PA. Patient provided device clinic contact [phone] and encouraged to call with additional questions or concerns. Although patient has chronic leads, reinforced arm restrictions secondary to hematoma formation. Patient verbalized understanding.

## 2021-06-03 ENCOUNTER — Ambulatory Visit: Payer: Medicare Other | Admitting: Student

## 2021-06-03 ENCOUNTER — Other Ambulatory Visit: Payer: Self-pay

## 2021-06-03 VITALS — Ht 72.0 in

## 2021-06-03 DIAGNOSIS — I10 Essential (primary) hypertension: Secondary | ICD-10-CM

## 2021-06-03 DIAGNOSIS — I495 Sick sinus syndrome: Secondary | ICD-10-CM

## 2021-06-03 DIAGNOSIS — I442 Atrioventricular block, complete: Secondary | ICD-10-CM

## 2021-06-03 NOTE — Progress Notes (Signed)
Pressure dressing remove.   No more focal area of swelling. Remains very slightly edematous circumferentially, but overall improved.  Encouraged patient with a pouch and gen change the new pocket may appear slightly larger than previous, but he does still have a small amount of swelling that should improve.   Pt to keep device/wound check 8/31. Knows to call if any worsening.  Casimiro Needle [address]" [place], PA-C  06/03/2021 10:00 AM

## 2021-06-08 ENCOUNTER — Telehealth: Payer: Self-pay

## 2021-06-08 ENCOUNTER — Other Ambulatory Visit: Payer: Self-pay

## 2021-06-08 ENCOUNTER — Ambulatory Visit (INDEPENDENT_AMBULATORY_CARE_PROVIDER_SITE_OTHER): Payer: Medicare Other

## 2021-06-08 DIAGNOSIS — I442 Atrioventricular block, complete: Secondary | ICD-10-CM

## 2021-06-08 LAB — CUP PACEART INCLINIC DEVICE CHECK
Battery Remaining Longevity: 140 mo
Battery Voltage: 3.22 V
Brady Statistic AP VP Percent: 99.75 %
Brady Statistic AP VS Percent: 0.02 %
Brady Statistic AS VP Percent: 0.16 %
Brady Statistic AS VS Percent: 0.07 %
Brady Statistic RA Percent Paced: 99.84 %
Brady Statistic RV Percent Paced: 99.91 %
Date Time Interrogation Session: 20220831114414
Implantable Lead Implant Date: 19980414
Implantable Lead Implant Date: 19980414
Implantable Lead Location: 753859
Implantable Lead Location: 753860
Implantable Pulse Generator Implant Date: 20220817
Lead Channel Impedance Value: 380 Ohm
Lead Channel Impedance Value: 399 Ohm
Lead Channel Impedance Value: 513 Ohm
Lead Channel Impedance Value: 665 Ohm
Lead Channel Pacing Threshold Amplitude: 0.5 V
Lead Channel Pacing Threshold Amplitude: 0.75 V
Lead Channel Pacing Threshold Pulse Width: 0.4 ms
Lead Channel Pacing Threshold Pulse Width: 0.4 ms
Lead Channel Setting Pacing Amplitude: 2 V
Lead Channel Setting Pacing Amplitude: 2 V
Lead Channel Setting Pacing Pulse Width: 0.4 ms
Lead Channel Setting Sensing Sensitivity: 2 mV

## 2021-06-08 NOTE — Telephone Encounter (Signed)
Patient was seen in device clinic today and noted not to have remote monitoring connected. Attempted to assist patient and change his password. Locked out of acct. For 20 minutes. States he had another place to be. Advised I will sent a message to remote specialist to call him to follow up to make sure he gets connected. Patient agreeable to plan.

## 2021-06-08 NOTE — Progress Notes (Signed)
Wound check appointment. Dermabond removed. Wound without redness or edema. Incision edges approximated, wound well healed. Normal device function. Thresholds, sensing, and impedances consistent with implant measurements. Device programmed at chronic settings, auto capture programmed on for extra safety margin until 3 month visit. Histogram distribution appropriate for patient and level of activity, appears flat. No mode switches or high ventricular rates noted. Patient educated about wound care. ROV in 3 months with Dr. Graciela Husbands.

## 2021-06-08 NOTE — Patient Instructions (Addendum)
? ?  After Your Pacemaker ? ? ?Monitor your pacemaker site for redness, swelling, and drainage. Call the device clinic at [phone] if you experience these symptoms or fever/chills. ? ?Your incision was closed with Dermabond:  You may shower 1 day after your defibrillator implant and wash your incision with soap and water. Avoid lotions, ointments, or perfumes over your incision until it is well-healed. ? ?You may use a hot tub or a pool after your wound check appointment if the incision is completely closed. ? ?You may drive, unless driving has been restricted by your healthcare providers. ? ?Your Pacemaker is not MRI compatible. ? ?Remote monitoring is used to monitor your pacemaker from home. This monitoring is scheduled every 91 days by our office. It allows us to keep an eye on the functioning of your device to ensure it is working properly. You will routinely see your Electrophysiologist annually (more often if necessary).  ?

## 2021-06-09 NOTE — Progress Notes (Signed)
Remote pacemaker transmission.   

## 2021-06-09 NOTE — Addendum Note (Signed)
Addended by: Geralyn Flash D on: 06/09/2021 12:38 PM   Modules accepted: Level of Service

## 2021-06-10 ENCOUNTER — Telehealth: Payer: Self-pay

## 2021-06-10 NOTE — Telephone Encounter (Signed)
LMOVM to call tech support for help with monitor.

## 2021-06-10 NOTE — Telephone Encounter (Signed)
I called Medtronic tech support to get additional help with the patient.

## 2021-06-10 NOTE — Telephone Encounter (Signed)
The transmission was send successful and he is back connected with the app

## 2021-06-10 NOTE — Telephone Encounter (Signed)
Transmission received. I put him on correct remote schedule in epic.

## 2021-08-26 ENCOUNTER — Ambulatory Visit (INDEPENDENT_AMBULATORY_CARE_PROVIDER_SITE_OTHER): Payer: Medicare Other

## 2021-08-26 DIAGNOSIS — I429 Cardiomyopathy, unspecified: Secondary | ICD-10-CM

## 2021-08-26 LAB — CUP PACEART REMOTE DEVICE CHECK
Battery Remaining Longevity: 143 mo
Battery Voltage: 3.2 V
Brady Statistic AP VP Percent: 99.14 %
Brady Statistic AP VS Percent: 0.01 %
Brady Statistic AS VP Percent: 0.73 %
Brady Statistic AS VS Percent: 0.12 %
Brady Statistic RA Percent Paced: 99.25 %
Brady Statistic RV Percent Paced: 99.87 %
Date Time Interrogation Session: 20221118093012
Implantable Lead Implant Date: 19980414
Implantable Lead Implant Date: 19980414
Implantable Lead Location: 753859
Implantable Lead Location: 753860
Implantable Pulse Generator Implant Date: 20220817
Lead Channel Impedance Value: 361 Ohm
Lead Channel Impedance Value: 380 Ohm
Lead Channel Impedance Value: 475 Ohm
Lead Channel Impedance Value: 627 Ohm
Lead Channel Pacing Threshold Amplitude: 0.375 V
Lead Channel Pacing Threshold Amplitude: 1 V
Lead Channel Pacing Threshold Pulse Width: 0.4 ms
Lead Channel Pacing Threshold Pulse Width: 0.4 ms
Lead Channel Sensing Intrinsic Amplitude: 3.5 mV
Lead Channel Sensing Intrinsic Amplitude: 3.5 mV
Lead Channel Setting Pacing Amplitude: 1.5 V
Lead Channel Setting Pacing Amplitude: 2 V
Lead Channel Setting Pacing Pulse Width: 0.4 ms
Lead Channel Setting Sensing Sensitivity: 2 mV

## 2021-09-05 ENCOUNTER — Encounter: Payer: Self-pay | Admitting: Internal Medicine

## 2021-09-05 ENCOUNTER — Other Ambulatory Visit: Payer: Self-pay

## 2021-09-05 ENCOUNTER — Ambulatory Visit: Payer: Medicare Other | Admitting: Internal Medicine

## 2021-09-05 VITALS — BP 130/60 | HR 60 | Ht 72.0 in | Wt 206.8 lb

## 2021-09-05 DIAGNOSIS — I495 Sick sinus syndrome: Secondary | ICD-10-CM

## 2021-09-05 DIAGNOSIS — I442 Atrioventricular block, complete: Secondary | ICD-10-CM | POA: Diagnosis not present

## 2021-09-05 DIAGNOSIS — Z950 Presence of cardiac pacemaker: Secondary | ICD-10-CM

## 2021-09-05 LAB — CUP PACEART INCLINIC DEVICE CHECK
Battery Remaining Longevity: 142 mo
Battery Voltage: 3.2 V
Brady Statistic AP VP Percent: 98.68 %
Brady Statistic AP VS Percent: 0.01 %
Brady Statistic AS VP Percent: 1.19 %
Brady Statistic AS VS Percent: 0.12 %
Brady Statistic RA Percent Paced: 98.79 %
Brady Statistic RV Percent Paced: 99.87 %
Date Time Interrogation Session: 20221128172017
Implantable Lead Implant Date: 19980414
Implantable Lead Implant Date: 19980414
Implantable Lead Location: 753859
Implantable Lead Location: 753860
Implantable Pulse Generator Implant Date: 20220817
Lead Channel Impedance Value: 342 Ohm
Lead Channel Impedance Value: 380 Ohm
Lead Channel Impedance Value: 475 Ohm
Lead Channel Impedance Value: 608 Ohm
Lead Channel Pacing Threshold Amplitude: 0.5 V
Lead Channel Pacing Threshold Amplitude: 0.75 V
Lead Channel Pacing Threshold Pulse Width: 0.4 ms
Lead Channel Pacing Threshold Pulse Width: 0.4 ms
Lead Channel Sensing Intrinsic Amplitude: 3.5 mV
Lead Channel Sensing Intrinsic Amplitude: 5.375 mV
Lead Channel Setting Pacing Amplitude: 1.5 V
Lead Channel Setting Pacing Amplitude: 2 V
Lead Channel Setting Pacing Pulse Width: 0.4 ms
Lead Channel Setting Sensing Sensitivity: 2 mV

## 2021-09-05 NOTE — Patient Instructions (Signed)
Medication Instructions:  Your physician recommends that you continue on your current medications as directed. Please refer to the Current Medication list given to you today.  *If you need a refill on your cardiac medications before your next appointment, please call your pharmacy*   Lab Work: None ordered.  If you have labs (blood work) drawn today and your tests are completely normal, you will receive your results only by: MyChart Message (if you have MyChart) OR A paper copy in the mail If you have any lab test that is abnormal or we need to change your treatment, we will call you to review the results.   Testing/Procedures: None ordered.    Follow-Up: At [facility], you and your health needs are our priority.  As part of our continuing mission to provide you with exceptional heart care, we have created designated Provider Care Teams.  These Care Teams include your primary Cardiologist (physician) and Advanced Practice Providers (APPs -  Physician Assistants and Nurse Practitioners) who all work together to provide you with the care you need, when you need it.  We recommend signing up for the patient portal called "MyChart".  Sign up information is provided on this After Visit Summary.  MyChart is used to connect with patients for Virtual Visits (Telemedicine).  Patients are able to view lab/test results, encounter notes, upcoming appointments, etc.  Non-urgent messages can be sent to your provider as well.   To learn more about what you can do with MyChart, go to [email or web address].    Your next appointment:   12 months with Dr Graciela Husbands    Other Instructions Device clinic phone number [phone]

## 2021-09-05 NOTE — Progress Notes (Signed)
Remote pacemaker transmission.   

## 2021-09-05 NOTE — Progress Notes (Signed)
HPI  Luke Schmidt is a 80 y.o. 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 He underwent generator replacement 2010, 2022   He just unrelated the most unfortunate news to me that he is a [employer] football fan and travels the country watching football.:))  Also a Licensed conveyancer    Today, he has been doing fine. He does not have any major complaints. For exercise, he walks for 2 miles, plays golf, and does yard work. He has to pause and slow down for his wife because of her orthopedic issues. He does not check his blood pressure at home.   The patient denies chest pain, shortness of breath, nocturnal dyspnea, orthopnea or peripheral edema. There have been no palpitations, lightheadedness or syncope.       DATE TEST EF   12/17 Echo   55-65 %   5/22 Echo 50-55 % Moderate aortic calcification   Date Cr K Hgb  4/16 0.92 3.6 16.2  7/22 1.36 3.8 14.4     Past Medical History:  Diagnosis Date   Complete heart block (HCC)    HTN (hypertension)    Hypertriglyceridemia    Pacemaker    Medtronic adapta  L; generator replacement 2010   Sinus node dysfunction [facility])     Past Surgical History:  Procedure Laterality Date   PACEMAKER INSERTION     Medtronic    PPM GENERATOR CHANGEOUT N/A 05/25/2021   Procedure: PPM GENERATOR CHANGEOUT;  Surgeon: Duke Salvia, MD;  Location: [facility] INVASIVE CV LAB;  Service: Cardiovascular;  Laterality: N/A;    Current Outpatient Medications  Medication Sig Dispense Refill   amLODipine (NORVASC) 10 MG tablet Take 1 tablet (10 mg total) by mouth daily. 30 tablet 11   atorvastatin (LIPITOR) 10 MG tablet Take 10 mg by mouth daily.      losartan-hydrochlorothiazide (HYZAAR) 100-12.5 MG tablet Take 1 tablet by mouth in the morning.     potassium chloride (MICRO-K) 10 MEQ CR capsule Take 10 mEq by mouth in the morning.     No current facility-administered medications for this  visit.    Allergies  Allergen Reactions   Penicillins Hives    Review of Systems negative except from HPI and PMH  Physical Exam BP 130/60   Pulse 60   Ht 6' (1.829 m)   Wt 206 lb 12.8 oz (93.8 kg)   SpO2 97%   BMI 28.05 kg/m  Well developed and well nourished in no acute distress HENT normal Neck supple with JVP-flat Clear Device pocket well healed; without hematoma or erythema.  There is no tethering  Regular rate and rhythm, no gallop No murmur Abd-soft with active BS No Clubbing cyanosis no edema Skin-warm and dry A & Oriented  Grossly normal sensory and motor function  ECG AV pacing   Assessment and  Plan  Complete heart block  Sinus node dysfunction  Pacemaker-Medtronic  hypertension    BP well controlled continue amlodipine 10 and losartan/Hct 100/12.5  Sinus node dysfunction, progressive-- described the physiology and will activate rate response and will await his own assessment of status   I,Mykaella Javier,acting as a scribe for [name], MD.,have documented all relevant documentation on the behalf of [name], MD,as directed by  [name], MD while in the presence of [name], MD.  I, [name], MD, have reviewed all documentation for this visit. The documentation on 09/05/21 for the exam, diagnosis, procedures,  and orders are all accurate and complete.

## 2021-09-05 NOTE — Progress Notes (Deleted)
  HPI  Luke Schmidt is a 80 y.o. male Seen in followup for pacemaker implantation years ago for complete heart block. He  underwent generator replacement 2010.   He  just unrelated the most unfortunate news to me that he is a [facility] football fan and travels the country watching football.:))  Also a  Licensed conveyancer  The patient denies chest pain, shortness of breath, nocturnal dyspnea, orthopnea or peripheral edema.  There have been no palpitations, lightheadedness or syncope  Interval cold >> COVID positive, he and his wife  .     DATE TEST EF   12/17 Echo   55-65 %           Past Medical History:  Diagnosis Date   Complete heart block (HCC)    HTN (hypertension)    Hypertriglyceridemia    Pacemaker    Medtronic adapta  L; generator replacement 2010   Sinus node dysfunction [facility])     Past Surgical History:  Procedure Laterality Date   PACEMAKER INSERTION     Medtronic    PPM GENERATOR CHANGEOUT N/A 05/25/2021   Procedure: PPM GENERATOR CHANGEOUT;  Surgeon: Duke Salvia, MD;  Location: [place] INVASIVE CV LAB;  Service: Cardiovascular;  Laterality: N/A;    Current Outpatient Medications  Medication Sig Dispense Refill   amLODipine (NORVASC) 10 MG tablet Take 1 tablet (10 mg total) by mouth daily. 30 tablet 11   atorvastatin (LIPITOR) 10 MG tablet Take 10 mg by mouth daily.      losartan-hydrochlorothiazide (HYZAAR) 100-12.5 MG tablet Take 1 tablet by mouth in the morning.     potassium chloride (MICRO-K) 10 MEQ CR capsule Take 10 mEq by mouth in the morning.     No current facility-administered medications for this visit.    Allergies  Allergen Reactions   Penicillins Hives    Review of Systems negative except from HPI and PMH  Physical Exam There were no vitals taken for this visit. Well developed and well nourished in no acute distress HENT normal Neck supple with JVP-flat Clear Device pocket well healed; without hematoma or erythema.  There is no  tethering  Regular rate and rhythm, no *** gallop No ***/*** murmur Abd-soft with active BS No Clubbing cyanosis *** edema Skin-warm and dry A & Oriented  Grossly normal sensory and motor function  ECG *** Well developed and well nourished in no acute distress HENT normal Neck supple with JVP-flat Clear Device pocket well healed; without hematoma or erythema.  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

## 2021-10-04 ENCOUNTER — Encounter: Payer: Medicare Other | Admitting: Internal Medicine

## 2021-11-25 ENCOUNTER — Ambulatory Visit (INDEPENDENT_AMBULATORY_CARE_PROVIDER_SITE_OTHER): Payer: Medicare Other

## 2021-11-25 DIAGNOSIS — I495 Sick sinus syndrome: Secondary | ICD-10-CM | POA: Diagnosis not present

## 2021-11-25 LAB — CUP PACEART REMOTE DEVICE CHECK
Battery Remaining Longevity: 139 mo
Battery Voltage: 3.16 V
Brady Statistic AP VP Percent: 99.75 %
Brady Statistic AP VS Percent: 0.01 %
Brady Statistic AS VP Percent: 0.17 %
Brady Statistic AS VS Percent: 0.07 %
Brady Statistic RA Percent Paced: 99.83 %
Brady Statistic RV Percent Paced: 99.92 %
Date Time Interrogation Session: 20230217091648
Implantable Lead Implant Date: 19980414
Implantable Lead Implant Date: 19980414
Implantable Lead Location: 753859
Implantable Lead Location: 753860
Implantable Pulse Generator Implant Date: 20220817
Lead Channel Impedance Value: 342 Ohm
Lead Channel Impedance Value: 361 Ohm
Lead Channel Impedance Value: 494 Ohm
Lead Channel Impedance Value: 646 Ohm
Lead Channel Pacing Threshold Amplitude: 0.5 V
Lead Channel Pacing Threshold Amplitude: 0.625 V
Lead Channel Pacing Threshold Pulse Width: 0.4 ms
Lead Channel Pacing Threshold Pulse Width: 0.4 ms
Lead Channel Sensing Intrinsic Amplitude: 4.625 mV
Lead Channel Sensing Intrinsic Amplitude: 4.625 mV
Lead Channel Setting Pacing Amplitude: 1.5 V
Lead Channel Setting Pacing Amplitude: 2 V
Lead Channel Setting Pacing Pulse Width: 0.4 ms
Lead Channel Setting Sensing Sensitivity: 2 mV

## 2021-11-30 NOTE — Progress Notes (Signed)
Remote pacemaker transmission.   

## 2022-02-24 ENCOUNTER — Ambulatory Visit (INDEPENDENT_AMBULATORY_CARE_PROVIDER_SITE_OTHER): Payer: Medicare Other

## 2022-02-24 DIAGNOSIS — I495 Sick sinus syndrome: Secondary | ICD-10-CM

## 2022-02-27 LAB — CUP PACEART REMOTE DEVICE CHECK
Battery Remaining Longevity: 138 mo
Battery Voltage: 3.11 V
Brady Statistic AP VP Percent: 99.6 %
Brady Statistic AP VS Percent: 0.01 %
Brady Statistic AS VP Percent: 0.24 %
Brady Statistic AS VS Percent: 0.15 %
Brady Statistic RA Percent Paced: 99.75 %
Brady Statistic RV Percent Paced: 99.84 %
Date Time Interrogation Session: 20230520110329
Implantable Lead Implant Date: 19980414
Implantable Lead Implant Date: 19980414
Implantable Lead Location: 753859
Implantable Lead Location: 753860
Implantable Pulse Generator Implant Date: 20220817
Lead Channel Impedance Value: 361 Ohm
Lead Channel Impedance Value: 380 Ohm
Lead Channel Impedance Value: 646 Ohm
Lead Channel Impedance Value: 779 Ohm
Lead Channel Pacing Threshold Amplitude: 0.5 V
Lead Channel Pacing Threshold Amplitude: 0.875 V
Lead Channel Pacing Threshold Pulse Width: 0.4 ms
Lead Channel Pacing Threshold Pulse Width: 0.4 ms
Lead Channel Sensing Intrinsic Amplitude: 4.625 mV
Lead Channel Sensing Intrinsic Amplitude: 4.625 mV
Lead Channel Setting Pacing Amplitude: 1.5 V
Lead Channel Setting Pacing Amplitude: 2 V
Lead Channel Setting Pacing Pulse Width: 0.4 ms
Lead Channel Setting Sensing Sensitivity: 2 mV

## 2022-03-02 NOTE — Progress Notes (Signed)
Remote pacemaker transmission.   

## 2022-05-26 ENCOUNTER — Ambulatory Visit (INDEPENDENT_AMBULATORY_CARE_PROVIDER_SITE_OTHER): Payer: Medicare Other

## 2022-05-26 DIAGNOSIS — I495 Sick sinus syndrome: Secondary | ICD-10-CM | POA: Diagnosis not present

## 2022-05-26 LAB — CUP PACEART REMOTE DEVICE CHECK
Battery Remaining Longevity: 133 mo
Battery Voltage: 3.05 V
Brady Statistic AP VP Percent: 99.49 %
Brady Statistic AP VS Percent: 0.01 %
Brady Statistic AS VP Percent: 0.27 %
Brady Statistic AS VS Percent: 0.23 %
Brady Statistic RA Percent Paced: 99.71 %
Brady Statistic RV Percent Paced: 99.77 %
Date Time Interrogation Session: 20230818115229
Implantable Lead Implant Date: 19980414
Implantable Lead Implant Date: 19980414
Implantable Lead Location: 753859
Implantable Lead Location: 753860
Implantable Pulse Generator Implant Date: 20220817
Lead Channel Impedance Value: 361 Ohm
Lead Channel Impedance Value: 380 Ohm
Lead Channel Impedance Value: 494 Ohm
Lead Channel Impedance Value: 646 Ohm
Lead Channel Pacing Threshold Amplitude: 0.375 V
Lead Channel Pacing Threshold Amplitude: 0.75 V
Lead Channel Pacing Threshold Pulse Width: 0.4 ms
Lead Channel Pacing Threshold Pulse Width: 0.4 ms
Lead Channel Sensing Intrinsic Amplitude: 5.375 mV
Lead Channel Sensing Intrinsic Amplitude: 5.375 mV
Lead Channel Setting Pacing Amplitude: 1.5 V
Lead Channel Setting Pacing Amplitude: 2 V
Lead Channel Setting Pacing Pulse Width: 0.4 ms
Lead Channel Setting Sensing Sensitivity: 2 mV

## 2022-06-21 NOTE — Progress Notes (Signed)
Remote pacemaker transmission.   

## 2022-08-25 ENCOUNTER — Ambulatory Visit (INDEPENDENT_AMBULATORY_CARE_PROVIDER_SITE_OTHER): Payer: Medicare Other

## 2022-08-25 DIAGNOSIS — I495 Sick sinus syndrome: Secondary | ICD-10-CM | POA: Diagnosis not present

## 2022-08-25 DIAGNOSIS — Z950 Presence of cardiac pacemaker: Secondary | ICD-10-CM | POA: Diagnosis not present

## 2022-08-25 LAB — CUP PACEART REMOTE DEVICE CHECK
Battery Remaining Longevity: 129 mo
Battery Voltage: 3.03 V
Brady Statistic AP VP Percent: 99.53 %
Brady Statistic AP VS Percent: 0.02 %
Brady Statistic AS VP Percent: 0.1 %
Brady Statistic AS VS Percent: 0.35 %
Brady Statistic RA Percent Paced: 99.9 %
Brady Statistic RV Percent Paced: 99.63 %
Date Time Interrogation Session: 20231116221134
Implantable Lead Connection Status: 753985
Implantable Lead Connection Status: 753985
Implantable Lead Implant Date: 19980414
Implantable Lead Implant Date: 19980414
Implantable Lead Location: 753859
Implantable Lead Location: 753860
Implantable Pulse Generator Implant Date: 20220817
Lead Channel Impedance Value: 361 Ohm
Lead Channel Impedance Value: 380 Ohm
Lead Channel Impedance Value: 494 Ohm
Lead Channel Impedance Value: 627 Ohm
Lead Channel Pacing Threshold Amplitude: 0.375 V
Lead Channel Pacing Threshold Amplitude: 0.875 V
Lead Channel Pacing Threshold Pulse Width: 0.4 ms
Lead Channel Pacing Threshold Pulse Width: 0.4 ms
Lead Channel Sensing Intrinsic Amplitude: 5.125 mV
Lead Channel Sensing Intrinsic Amplitude: 5.125 mV
Lead Channel Setting Pacing Amplitude: 1.5 V
Lead Channel Setting Pacing Amplitude: 2 V
Lead Channel Setting Pacing Pulse Width: 0.4 ms
Lead Channel Setting Sensing Sensitivity: 2 mV
Zone Setting Status: 755011

## 2022-09-07 ENCOUNTER — Encounter: Payer: Self-pay | Admitting: Internal Medicine

## 2022-09-07 ENCOUNTER — Ambulatory Visit: Payer: Medicare Other | Attending: Internal Medicine | Admitting: Internal Medicine

## 2022-09-07 VITALS — BP 118/66 | HR 81 | Ht 72.0 in | Wt 205.0 lb

## 2022-09-07 DIAGNOSIS — I442 Atrioventricular block, complete: Secondary | ICD-10-CM | POA: Insufficient documentation

## 2022-09-07 DIAGNOSIS — Z950 Presence of cardiac pacemaker: Secondary | ICD-10-CM | POA: Diagnosis present

## 2022-09-07 LAB — CUP PACEART INCLINIC DEVICE CHECK
Battery Remaining Longevity: 130 mo
Battery Voltage: 3.03 V
Brady Statistic AP VP Percent: 99.58 %
Brady Statistic AP VS Percent: 0.01 %
Brady Statistic AS VP Percent: 0.19 %
Brady Statistic AS VS Percent: 0.21 %
Brady Statistic RA Percent Paced: 99.8 %
Brady Statistic RV Percent Paced: 99.78 %
Date Time Interrogation Session: 20231130171735
Implantable Lead Connection Status: 753985
Implantable Lead Connection Status: 753985
Implantable Lead Implant Date: 19980414
Implantable Lead Implant Date: 19980414
Implantable Lead Location: 753859
Implantable Lead Location: 753860
Implantable Pulse Generator Implant Date: 20220817
Lead Channel Impedance Value: 399 Ohm
Lead Channel Impedance Value: 399 Ohm
Lead Channel Impedance Value: 513 Ohm
Lead Channel Impedance Value: 646 Ohm
Lead Channel Pacing Threshold Amplitude: 0.375 V
Lead Channel Pacing Threshold Amplitude: 0.5 V
Lead Channel Pacing Threshold Amplitude: 0.75 V
Lead Channel Pacing Threshold Amplitude: 0.875 V
Lead Channel Pacing Threshold Pulse Width: 0.4 ms
Lead Channel Pacing Threshold Pulse Width: 0.4 ms
Lead Channel Pacing Threshold Pulse Width: 0.4 ms
Lead Channel Pacing Threshold Pulse Width: 0.4 ms
Lead Channel Sensing Intrinsic Amplitude: 6.25 mV
Lead Channel Sensing Intrinsic Amplitude: 6.25 mV
Lead Channel Setting Pacing Amplitude: 1.5 V
Lead Channel Setting Pacing Amplitude: 2 V
Lead Channel Setting Pacing Pulse Width: 0.4 ms
Lead Channel Setting Sensing Sensitivity: 2 mV
Zone Setting Status: 755011

## 2022-09-07 NOTE — Progress Notes (Signed)
   HPI  Luke Schmidt is a 81 y.o. 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 He underwent generator replacement 2010, 2022   He just unrelated the most unfortunate news to me that he is a 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  There have been no palpitations, lightheadedness or syncope.    Marland Kitchen       DATE TEST EF   12/17 Echo   55-65 %   5/22 Echo 50-55 % Moderate aortic calcification   Date Cr K Hgb  4/16 0.92 3.6 16.2  7/22 1.36 3.8 14.4  2/223 1.13 3.9 14.4     Past Medical History:  Diagnosis Date   Complete heart block (HCC)    HTN (hypertension)    Hypertriglyceridemia    Pacemaker    Medtronic adapta  L; generator replacement 2010   Sinus node dysfunction [facility])     Past Surgical History:  Procedure Laterality Date   PACEMAKER INSERTION     Medtronic    PPM GENERATOR CHANGEOUT N/A 05/25/2021   Procedure: PPM GENERATOR CHANGEOUT;  Surgeon: Duke Salvia, MD;  Location: [facility] INVASIVE CV LAB;  Service: Cardiovascular;  Laterality: N/A;    Current Outpatient Medications  Medication Sig Dispense Refill   amLODipine (NORVASC) 10 MG tablet Take 1 tablet (10 mg total) by mouth daily. 30 tablet 11   atorvastatin (LIPITOR) 10 MG tablet Take 10 mg by mouth daily.      losartan-hydrochlorothiazide (HYZAAR) 100-12.5 MG tablet Take 1 tablet by mouth in the morning.     potassium chloride (MICRO-K) 10 MEQ CR capsule Take 10 mEq by mouth in the morning.     No current facility-administered medications for this visit.    Allergies  Allergen Reactions   Penicillins Hives    Review of Systems negative except from HPI and PMH  Physical Exam BP 118/66   Pulse 81   Ht 6' (1.829 m)   Wt 205 lb (93 kg)   SpO2 92%   BMI 27.80 kg/m   Well developed and well nourished in no acute distress HENT normal Neck supple with JVP-flat Clear Device pocket well healed; without hematoma or erythema.  There is no tethering  Regular rate and rhythm, no  murmur Abd-soft with active BS No Clubbing cyanosis  edema Skin-warm and dry A & Oriented  Grossly normal sensory and motor function  ECG AV pacing  Device function is normal. Programming changes   See Paceart for details    Assessment and  Plan  Complete heart block  Sinus node dysfunction  Pacemaker-Medtronic  hypertension   Blood pressure well controlled.  Continue losartan and amlodipine.  Takes hydrochlorothiazide as well as potassium with normal levels.  Device function is normal heart rate excursion adequate

## 2022-09-07 NOTE — Patient Instructions (Signed)
Medication Instructions:  Your physician recommends that you continue on your current medications as directed. Please refer to the Current Medication list given to you today.  *If you need a refill on your cardiac medications before your next appointment, please call your pharmacy*   Lab Work: None ordered.  If you have labs (blood work) drawn today and your tests are completely normal, you will receive your results only by: MyChart Message (if you have MyChart) OR A paper copy in the mail If you have any lab test that is abnormal or we need to change your treatment, we will call you to review the results.   Testing/Procedures: None ordered.    Follow-Up: At [facility], you and your health needs are our priority.  As part of our continuing mission to provide you with exceptional heart care, we have created designated Provider Care Teams.  These Care Teams include your primary Cardiologist (physician) and Advanced Practice Providers (APPs -  Physician Assistants and Nurse Practitioners) who all work together to provide you with the care you need, when you need it.  We recommend signing up for the patient portal called "MyChart".  Sign up information is provided on this After Visit Summary.  MyChart is used to connect with patients for Virtual Visits (Telemedicine).  Patients are able to view lab/test results, encounter notes, upcoming appointments, etc.  Non-urgent messages can be sent to your provider as well.   To learn more about what you can do with MyChart, go to [email or web address].    Your next appointment:   12 months with Dr Klein  Important Information About Sugar       

## 2022-09-11 NOTE — Progress Notes (Signed)
Remote pacemaker transmission.   

## 2023-03-02 ENCOUNTER — Telehealth: Payer: Self-pay | Admitting: Internal Medicine

## 2023-03-02 ENCOUNTER — Ambulatory Visit: Payer: Medicare Other | Attending: Cardiovascular Disease

## 2023-03-02 DIAGNOSIS — I442 Atrioventricular block, complete: Secondary | ICD-10-CM

## 2023-03-02 LAB — CUP PACEART INCLINIC DEVICE CHECK
Battery Remaining Longevity: 123 mo
Battery Voltage: 3.02 V
Brady Statistic AP VP Percent: 99.54 %
Brady Statistic AP VS Percent: 0.02 %
Brady Statistic AS VP Percent: 0.15 %
Brady Statistic AS VS Percent: 0.3 %
Brady Statistic RA Percent Paced: 99.85 %
Brady Statistic RV Percent Paced: 99.68 %
Date Time Interrogation Session: 20240524165418
Implantable Lead Connection Status: 753985
Implantable Lead Connection Status: 753985
Implantable Lead Implant Date: 19980414
Implantable Lead Implant Date: 19980414
Implantable Lead Location: 753859
Implantable Lead Location: 753860
Implantable Pulse Generator Implant Date: 20220817
Lead Channel Impedance Value: 380 Ohm
Lead Channel Impedance Value: 380 Ohm
Lead Channel Impedance Value: 532 Ohm
Lead Channel Impedance Value: 665 Ohm
Lead Channel Pacing Threshold Amplitude: 0.375 V
Lead Channel Pacing Threshold Amplitude: 0.875 V
Lead Channel Pacing Threshold Pulse Width: 0.4 ms
Lead Channel Pacing Threshold Pulse Width: 0.4 ms
Lead Channel Sensing Intrinsic Amplitude: 6.25 mV
Lead Channel Sensing Intrinsic Amplitude: 6.25 mV
Lead Channel Setting Pacing Amplitude: 1.5 V
Lead Channel Setting Pacing Amplitude: 2 V
Lead Channel Setting Pacing Pulse Width: 0.4 ms
Lead Channel Setting Sensing Sensitivity: 2 mV
Zone Setting Status: 755011

## 2023-03-02 NOTE — Telephone Encounter (Signed)
Spoke with pt who complains of increasing SOB and fatigue on exertion x 1 week.  Pt denies CP, dizziness or edema.  Pt saw his PCP about 6 weeks ago.  Current BP 145/74 with HR - 66.  Requested pt send a PPM transmission.  Pt states he will need assistance.  Pt advised will forward to our device clinic for further assistance and reveiw.  Pt verbalizes understanding and agrees with current plan.

## 2023-03-02 NOTE — Telephone Encounter (Signed)
Luke Schmidt states there is no DOD appointment for this afternoon.   I put him on device clinic schedule and if need be Lanora Manis can consult DOD.

## 2023-03-02 NOTE — Progress Notes (Addendum)
Pacemaker check in clinic. due to complaints of SOB. Patient was not SOB during apt nor when walking to and from device room. Patient admitted to SOB during playing golf.  Normal device function. Thresholds, sensing, impedances consistent with previous measurements. Device programmed to maximize longevity. No mode switch or high ventricular rates noted. Device programmed at appropriate safety margins. Histogram distribution appropriate for patient activity level. Device programmed to optimize intrinsic conduction. Estimated longevity 10 years . Patient given new remote monitor.

## 2023-03-02 NOTE — Telephone Encounter (Signed)
Please make a DOD appointment this afternoon and Lanora Manis will check the device.

## 2023-03-02 NOTE — Telephone Encounter (Signed)
I tried to help with the patient send a transmission. I told the patient I will see if the nurse will see them today to check his device. I will call the patient wife Luke Schmidt after I speak with the nurse. Peggy phone number is [phone].

## 2023-03-02 NOTE — Telephone Encounter (Signed)
Pt c/o Shortness Of Breath: STAT if SOB developed within the last 24 hours or pt is noticeably SOB on the phone  1. Are you currently SOB (can you hear that pt is SOB on the phone)? No   2. How long have you been experiencing SOB? One week    3. Are you SOB when sitting or when up moving around? Moving around , any kind of activity. For example when pt hitting golf balls and had to stop because he was so exhausted.   4. Are you currently experiencing any other symptoms?  Fatigue

## 2023-03-07 ENCOUNTER — Ambulatory Visit: Payer: Medicare Other | Attending: Internal Medicine

## 2023-03-07 DIAGNOSIS — I495 Sick sinus syndrome: Secondary | ICD-10-CM

## 2023-03-08 LAB — CUP PACEART REMOTE DEVICE CHECK
Battery Remaining Longevity: 121 mo
Battery Voltage: 3.02 V
Brady Statistic AP VP Percent: 99.71 %
Brady Statistic AP VS Percent: 0.02 %
Brady Statistic AS VP Percent: 0.1 %
Brady Statistic AS VS Percent: 0.17 %
Brady Statistic RA Percent Paced: 99.89 %
Brady Statistic RV Percent Paced: 99.81 %
Date Time Interrogation Session: 20240529121812
Implantable Lead Connection Status: 753985
Implantable Lead Connection Status: 753985
Implantable Lead Implant Date: 19980414
Implantable Lead Implant Date: 19980414
Implantable Lead Location: 753859
Implantable Lead Location: 753860
Implantable Pulse Generator Implant Date: 20220817
Lead Channel Impedance Value: 361 Ohm
Lead Channel Impedance Value: 361 Ohm
Lead Channel Impedance Value: 475 Ohm
Lead Channel Impedance Value: 608 Ohm
Lead Channel Pacing Threshold Amplitude: 0.375 V
Lead Channel Pacing Threshold Amplitude: 0.75 V
Lead Channel Pacing Threshold Pulse Width: 0.4 ms
Lead Channel Pacing Threshold Pulse Width: 0.4 ms
Lead Channel Sensing Intrinsic Amplitude: 4.5 mV
Lead Channel Sensing Intrinsic Amplitude: 4.5 mV
Lead Channel Setting Pacing Amplitude: 1.5 V
Lead Channel Setting Pacing Amplitude: 2 V
Lead Channel Setting Pacing Pulse Width: 0.4 ms
Lead Channel Setting Sensing Sensitivity: 2 mV
Zone Setting Status: 755011

## 2023-03-28 NOTE — Progress Notes (Signed)
Remote pacemaker transmission.   

## 2023-05-25 NOTE — Progress Notes (Unsigned)
Initial neurology clinic note  Luke Schmidt MRN: [id number] DOB: 03-Jan-1941  Referring provider: Reubin Milan, MD  Primary care provider: Kirby Funk, MD (Inactive)  Reason for consult:  left facial weakness  Subjective:  This is Luke Schmidt, a 82 y.o. right-handed male with a medical history of HTN, HLD, AV block s/p PPM, nephrolithiasis who presents to neurology clinic with left facial weakness. The patient is alone today.  On 05/18/23 patient had discomfort in his left ear. There was no associated rash. The next morning, 05/19/23, he woke up and went to the bathroom and couldn't spit. He then noticed his left face did not work like his right side of his face. He called his PCP office and was seen on 05/19/23. PCP noted weakness including the frontalis and diagnosed patient with left Bell's palsy. He was started on Predinsone 60 mg for 7 days and Valacyclovir for 7 days. He started noticing improvement with significant improvement on 05/29/23. He currently has minimal symptoms.  Patient has never had similar symptoms previously.  MEDICATIONS:  Outpatient Encounter Medications as of 05/31/2023  Medication Sig   amLODipine (NORVASC) 10 MG tablet Take 1 tablet (10 mg total) by mouth daily.   atorvastatin (LIPITOR) 10 MG tablet Take 10 mg by mouth daily.    losartan-hydrochlorothiazide (HYZAAR) 100-12.5 MG tablet Take 1 tablet by mouth in the morning.   potassium chloride (MICRO-K) 10 MEQ CR capsule Take 10 mEq by mouth in the morning.   No facility-administered encounter medications on file as of 05/31/2023.    PAST MEDICAL HISTORY: Past Medical History:  Diagnosis Date   Complete heart block (HCC)    HTN (hypertension)    Hypertriglyceridemia    Pacemaker    Medtronic adapta  L; generator replacement 2010   Sinus node dysfunction (HCC)     PAST SURGICAL HISTORY: Past Surgical History:  Procedure Laterality Date   PACEMAKER INSERTION     Medtronic    PPM  GENERATOR CHANGEOUT N/A 05/25/2021   Procedure: PPM GENERATOR CHANGEOUT;  Surgeon: Duke Salvia, MD;  Location: [facility] INVASIVE CV LAB;  Service: Cardiovascular;  Laterality: N/A;    ALLERGIES: Allergies  Allergen Reactions   Penicillins Hives    FAMILY HISTORY: Family History  Problem Relation Age of Onset   Coronary artery disease Mother    Heart attack Father     SOCIAL HISTORY: Social History   Tobacco Use   Smoking status: Never   Smokeless tobacco: Never  Vaping Use   Vaping status: Never Used  Substance Use Topics   Alcohol use: Yes    Comment: 2-3 x week   Drug use: No   Social History   Social History Narrative   Are you right handed or left handed? right   Are you currently employed ?    What is your current occupation?retired   Do you live at home alone?   Who lives with you? wife   What type of home do you live in: 1 story or 2 story? two    Caffeine  4 a week     Objective:  Vital Signs:  BP 125/77   Pulse 77   Ht 6' (1.829 m)   Wt 199 lb (90.3 kg)   SpO2 95%   BMI 26.99 kg/m   General: No acute distress.  Patient appears well-groomed.   Head:  Normocephalic/atraumatic. No rash appreciated near left ear (or anywhere else) Neck: supple Heart: regular rate and rhythm Lungs:  Clear to auscultation bilaterally. Vascular: No carotid bruits.  Neurological Exam: Mental status: alert and oriented, speech fluent and not dysarthric, language intact.  Cranial nerves: CN I: not tested CN II: pupils equal, round and reactive to light, visual fields intact CN III, IV, VI:  full range of motion, no nystagmus, no ptosis CN V: facial sensation intact. CN VII: Left facial weakness: frontalis, orbicularis oculi, orbicularis oris CN VIII: Hearing intact with help of hearing aids CN IX, X: uvula midline CN XI: sternocleidomastoid and trapezius muscles intact CN XII: tongue midline  Bulk & Tone: normal, no fasciculations. Motor:  muscle strength 5/5  throughout Deep Tendon Reflexes:  2+ throughout,  toes downgoing.   Sensation:  Pinprick, temperature and vibratory sensation intact. Finger to nose testing:  Without dysmetria.   Heel to shin:  Without dysmetria.   Gait:  Normal station and stride.  Romberg negative.   Labs and Imaging review: Internal labs: BMP (05/05/21): significant for glucose of 110, Cr 1.36 CBC (05/04/21): unremarkable  Imaging: CT lumbar spine wo contrast (02/05/15): FINDINGS: There is no evidence of fracture or focal bone lesion. No endplate erosion or focal disc narrowing. No extra-spinal findings to explain pain.   Partially visualized bilateral renal cysts.   Degenerative changes:   T12- L1: Unremarkable.   L1-L2: Unremarkable.   L2-L3: Mild spondylosis. There is slight bulging of the disc without evidence of herniation. The canal and foramina are widely patent.   L3-L4: Spondylotic endplate spurring and slight disc bulging. Patent canal and foramina.   L4-L5: Spondylotic endplate spurring and slight disc bulging. Patent canal and foramina.   L5-S1:Mild spondylosis. No evidence of disc herniation or significant facet arthropathy. No evidence of impingement.   IMPRESSION: 1. No acute osseous findings. 2. Mild spondylosis.  No stenosis to explain radicular symptoms.   Assessment/Plan:  Luke Schmidt is a 82 y.o. male who presents for evaluation of left facial weakness. His examination is pertinent for isolated left facial weakness, including frontalis, orbicularis oculi, orbicularis oris, and asymmetric smile. His symptoms are consistent with left Bell's Palsy. He received 7 days of prednisone and valacyclovir on the day of (or day after) symptom onset. He is already showing improvement with only mild weakness. We discussed prognosis, which I expect to be very good.  PLAN: -Eye drops and lacrilube ointment due to eye closure weakness -Patient to call with new or worsening  symptoms  -Return to clinic as needed  The impression above as well as the plan as outlined below were extensively discussed with the patient who voiced understanding. All questions were answered to their satisfaction.  When available, results of the above investigations and possible further recommendations will be communicated to the patient via telephone/MyChart. Patient to call office if not contacted after expected testing turnaround time.   Total time spent reviewing records, interview, history/exam, documentation, and coordination of care on day of encounter:  30 min   Thank you for allowing me to participate in patient's care.  If I can answer any additional questions, I would be pleased to do so.  Jacquelyne Balint, MD   CC: Kirby Funk, MD (Inactive) [address] [place] [address]  CC: Referring provider: Reubin Milan, MD [address] [place],  [place] [address]

## 2023-05-31 ENCOUNTER — Encounter: Payer: Self-pay | Admitting: Neurology

## 2023-05-31 ENCOUNTER — Ambulatory Visit (INDEPENDENT_AMBULATORY_CARE_PROVIDER_SITE_OTHER): Payer: Medicare Other | Admitting: Neurology

## 2023-05-31 VITALS — BP 125/77 | HR 77 | Ht 72.0 in | Wt 199.0 lb

## 2023-05-31 DIAGNOSIS — G510 Bell's palsy: Secondary | ICD-10-CM

## 2023-05-31 DIAGNOSIS — R29810 Facial weakness: Secondary | ICD-10-CM

## 2023-05-31 MED ORDER — ARTIFICIAL TEARS OPHTHALMIC OINT: TOPICAL_OINTMENT | Freq: Every day | OPHTHALMIC | Status: DC

## 2023-05-31 NOTE — Patient Instructions (Signed)
I saw you today for left facial weakness. I agree with your previous doctor that this looks like Bell's Palsy, a viral attack on the nerve going to the facial nerves. You received the right treatment with 7 days of steroids and an antiviral. Now we just need to wait for the nerve to heal.  The good news is that your symptoms are much better already.  Your eye closure is still weak, so I recommend eye drops to keep them lubricated, as needed.  I recommend an eye ointment called Lacrilube to put on your eyelid at night to help it stay shut. You do not want your eye getting scratched.  You can pick up these at any drug store or online.  Please let me know if you have any questions or concerns.  The physicians and staff at [facility] are committed to providing excellent care. You may receive a survey requesting feedback about your experience at our office. We strive to receive "very good" responses to the survey questions. If you feel that your experience would prevent you from giving the office a "very good " response, please contact our office to try to remedy the situation. We may be reached at [phone]. Thank you for taking the time out of your busy day to complete the survey.  Jacquelyne Balint, MD [facility]

## 2023-06-06 ENCOUNTER — Ambulatory Visit (INDEPENDENT_AMBULATORY_CARE_PROVIDER_SITE_OTHER): Payer: Medicare Other

## 2023-06-06 ENCOUNTER — Ambulatory Visit: Payer: Medicare Other | Admitting: Neurology

## 2023-06-06 DIAGNOSIS — I442 Atrioventricular block, complete: Secondary | ICD-10-CM | POA: Diagnosis not present

## 2023-06-06 LAB — CUP PACEART REMOTE DEVICE CHECK
Battery Remaining Longevity: 121 mo
Battery Voltage: 3.01 V
Brady Statistic AP VP Percent: 99.58 %
Brady Statistic AP VS Percent: 0.01 %
Brady Statistic AS VP Percent: 0.16 %
Brady Statistic AS VS Percent: 0.25 %
Brady Statistic RA Percent Paced: 99.83 %
Brady Statistic RV Percent Paced: 99.74 %
Date Time Interrogation Session: 20240828014004
Implantable Lead Connection Status: 753985
Implantable Lead Connection Status: 753985
Implantable Lead Implant Date: 19980414
Implantable Lead Implant Date: 19980414
Implantable Lead Location: 753859
Implantable Lead Location: 753860
Implantable Pulse Generator Implant Date: 20220817
Lead Channel Impedance Value: 361 Ohm
Lead Channel Impedance Value: 380 Ohm
Lead Channel Impedance Value: 589 Ohm
Lead Channel Impedance Value: 703 Ohm
Lead Channel Pacing Threshold Amplitude: 0.5 V
Lead Channel Pacing Threshold Amplitude: 0.75 V
Lead Channel Pacing Threshold Pulse Width: 0.4 ms
Lead Channel Pacing Threshold Pulse Width: 0.4 ms
Lead Channel Sensing Intrinsic Amplitude: 19.625 mV
Lead Channel Sensing Intrinsic Amplitude: 19.625 mV
Lead Channel Sensing Intrinsic Amplitude: 7.5 mV
Lead Channel Sensing Intrinsic Amplitude: 7.5 mV
Lead Channel Setting Pacing Amplitude: 1.5 V
Lead Channel Setting Pacing Amplitude: 2 V
Lead Channel Setting Pacing Pulse Width: 0.4 ms
Lead Channel Setting Sensing Sensitivity: 2 mV
Zone Setting Status: 755011

## 2023-06-14 NOTE — Progress Notes (Signed)
Remote pacemaker transmission.   

## 2023-09-05 ENCOUNTER — Ambulatory Visit (INDEPENDENT_AMBULATORY_CARE_PROVIDER_SITE_OTHER): Payer: Medicare Other

## 2023-09-05 DIAGNOSIS — I495 Sick sinus syndrome: Secondary | ICD-10-CM

## 2023-09-05 LAB — CUP PACEART REMOTE DEVICE CHECK
Battery Remaining Longevity: 118 mo
Battery Voltage: 3.01 V
Brady Statistic AP VP Percent: 99.37 %
Brady Statistic AP VS Percent: 0.01 %
Brady Statistic AS VP Percent: 0.11 %
Brady Statistic AS VS Percent: 0.5 %
Brady Statistic RA Percent Paced: 99.88 %
Brady Statistic RV Percent Paced: 99.49 %
Date Time Interrogation Session: 20241127013823
Implantable Lead Connection Status: 753985
Implantable Lead Connection Status: 753985
Implantable Lead Implant Date: 19980414
Implantable Lead Implant Date: 19980414
Implantable Lead Location: 753859
Implantable Lead Location: 753860
Implantable Pulse Generator Implant Date: 20220817
Lead Channel Impedance Value: 361 Ohm
Lead Channel Impedance Value: 380 Ohm
Lead Channel Impedance Value: 608 Ohm
Lead Channel Impedance Value: 722 Ohm
Lead Channel Pacing Threshold Amplitude: 0.375 V
Lead Channel Pacing Threshold Amplitude: 0.75 V
Lead Channel Pacing Threshold Pulse Width: 0.4 ms
Lead Channel Pacing Threshold Pulse Width: 0.4 ms
Lead Channel Sensing Intrinsic Amplitude: 19.625 mV
Lead Channel Sensing Intrinsic Amplitude: 19.625 mV
Lead Channel Sensing Intrinsic Amplitude: 5 mV
Lead Channel Sensing Intrinsic Amplitude: 5 mV
Lead Channel Setting Pacing Amplitude: 1.5 V
Lead Channel Setting Pacing Amplitude: 2 V
Lead Channel Setting Pacing Pulse Width: 0.4 ms
Lead Channel Setting Sensing Sensitivity: 2 mV
Zone Setting Status: 755011

## 2023-09-12 ENCOUNTER — Ambulatory Visit: Payer: Medicare Other | Attending: Internal Medicine | Admitting: Internal Medicine

## 2023-09-13 ENCOUNTER — Encounter: Payer: Self-pay | Admitting: Internal Medicine

## 2023-09-18 ENCOUNTER — Encounter: Payer: Self-pay | Admitting: Internal Medicine

## 2023-09-18 ENCOUNTER — Ambulatory Visit: Payer: Medicare Other | Attending: Internal Medicine | Admitting: Internal Medicine

## 2023-09-18 VITALS — BP 116/65 | HR 65 | Ht 72.0 in | Wt 204.8 lb

## 2023-09-18 DIAGNOSIS — I442 Atrioventricular block, complete: Secondary | ICD-10-CM | POA: Insufficient documentation

## 2023-09-18 DIAGNOSIS — I495 Sick sinus syndrome: Secondary | ICD-10-CM | POA: Insufficient documentation

## 2023-09-18 DIAGNOSIS — Z950 Presence of cardiac pacemaker: Secondary | ICD-10-CM | POA: Insufficient documentation

## 2023-09-18 NOTE — Progress Notes (Signed)
   HPI  Luke Schmidt is a 82 y.o. 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  There have been no palpitations, lightheadedness or syncope.   Marland Kitchen       DATE TEST EF   12/17 Echo   55-65 %   5/22 Echo 50-55 % Moderate aortic calcification   Date Cr K Hgb  4/16 0.92 3.6 16.2  7/22 1.36 3.8 14.4  2/23 1.13 3.9 14.4  Labs drawn at [facility] not available   Past Medical History:  Diagnosis Date   Complete heart block (HCC)    HTN (hypertension)    Hypertriglyceridemia    Pacemaker    Medtronic adapta  L; generator replacement 2010   Sinus node dysfunction [facility])     Past Surgical History:  Procedure Laterality Date   PACEMAKER INSERTION     Medtronic    PPM GENERATOR CHANGEOUT N/A 05/25/2021   Procedure: PPM GENERATOR CHANGEOUT;  Surgeon: Duke Salvia, MD;  Location:  [facility] INVASIVE CV LAB;  Service: Cardiovascular;  Laterality: N/A;    Current Outpatient Medications  Medication Sig Dispense Refill   amLODipine (NORVASC) 10 MG tablet Take 1 tablet (10 mg total) by mouth daily. 30 tablet 11   atorvastatin (LIPITOR) 10 MG tablet Take 10 mg by mouth daily.      losartan-hydrochlorothiazide (HYZAAR) 100-12.5 MG tablet Take 1 tablet by mouth in the morning.     potassium chloride (MICRO-K) 10 MEQ CR capsule Take 10 mEq by mouth in the morning.     No current facility-administered medications for this visit.    Allergies  Allergen Reactions   Penicillins Hives    Review of Systems negative except from HPI and PMH  Physical Exam BP 116/65    Pulse 65   Ht 6' (1.829 m)   Wt 204 lb 12.8 oz (92.9 kg)   SpO2 97%   BMI 27.78 kg/m  Well developed and well nourished in no acute distress HENT normal Neck supple with JVP-flat Clear Device pocket well healed; without hematoma or erythema.  There is no tethering  Regular rate and rhythm, no  gallop No  murmur Abd-soft with active BS No Clubbing cyanosis  edema Skin-warm and dry A & Oriented  Grossly normal sensory and motor function  ECG AV pacing at 68  Device function is normal. Programming changes none  See Paceart for details    Assessment and  Plan  Complete heart block  Sinus node dysfunction  Pacemaker-Medtronic  hypertension   Blood pressure well-controlled on his regime.  No lightheadedness.  Will continue amlodipine and losartan HCT.  Needs his lab work checked, he feels to see his PCP soon.  100% ventricularly paced.  Atrial paced.  Heart rate excursion is adequate functional status remains excellent as noted.  No evidence of symptoms to suggest pacemaker cardiomyopathy

## 2023-09-18 NOTE — Patient Instructions (Signed)

## 2023-09-22 LAB — CUP PACEART INCLINIC DEVICE CHECK
Date Time Interrogation Session: 20241210193255
Implantable Lead Connection Status: 753985
Implantable Lead Connection Status: 753985
Implantable Lead Implant Date: 19980414
Implantable Lead Implant Date: 19980414
Implantable Lead Location: 753859
Implantable Lead Location: 753860
Implantable Pulse Generator Implant Date: 20220817

## 2023-12-03 ENCOUNTER — Encounter: Payer: Medicare Other | Admitting: Internal Medicine

## 2023-12-05 ENCOUNTER — Ambulatory Visit (INDEPENDENT_AMBULATORY_CARE_PROVIDER_SITE_OTHER): Payer: Medicare Other

## 2023-12-05 DIAGNOSIS — I442 Atrioventricular block, complete: Secondary | ICD-10-CM | POA: Diagnosis not present

## 2023-12-06 LAB — CUP PACEART REMOTE DEVICE CHECK
Battery Remaining Longevity: 114 mo
Battery Voltage: 3.01 V
Brady Statistic AP VP Percent: 99.72 %
Brady Statistic AP VS Percent: 0.02 %
Brady Statistic AS VP Percent: 0.1 %
Brady Statistic AS VS Percent: 0.17 %
Brady Statistic RA Percent Paced: 99.9 %
Brady Statistic RV Percent Paced: 99.81 %
Date Time Interrogation Session: 20250225234600
Implantable Lead Connection Status: 753985
Implantable Lead Connection Status: 753985
Implantable Lead Implant Date: 19980414
Implantable Lead Implant Date: 19980414
Implantable Lead Location: 753859
Implantable Lead Location: 753860
Implantable Pulse Generator Implant Date: 20220817
Lead Channel Impedance Value: 361 Ohm
Lead Channel Impedance Value: 380 Ohm
Lead Channel Impedance Value: 532 Ohm
Lead Channel Impedance Value: 646 Ohm
Lead Channel Pacing Threshold Amplitude: 0.375 V
Lead Channel Pacing Threshold Amplitude: 0.875 V
Lead Channel Pacing Threshold Pulse Width: 0.4 ms
Lead Channel Pacing Threshold Pulse Width: 0.4 ms
Lead Channel Sensing Intrinsic Amplitude: 19.625 mV
Lead Channel Sensing Intrinsic Amplitude: 19.625 mV
Lead Channel Sensing Intrinsic Amplitude: 6 mV
Lead Channel Sensing Intrinsic Amplitude: 6 mV
Lead Channel Setting Pacing Amplitude: 1.5 V
Lead Channel Setting Pacing Amplitude: 2 V
Lead Channel Setting Pacing Pulse Width: 0.4 ms
Lead Channel Setting Sensing Sensitivity: 2 mV
Zone Setting Status: 755011

## 2023-12-25 ENCOUNTER — Encounter: Payer: Self-pay | Admitting: Internal Medicine

## 2024-01-08 NOTE — Progress Notes (Signed)
 Remote pacemaker transmission.

## 2024-01-08 NOTE — Addendum Note (Signed)
 Addended by: Elease Etienne A on: 01/08/2024 10:50 AM   Modules accepted: Orders

## 2024-03-05 ENCOUNTER — Ambulatory Visit: Payer: Medicare Other

## 2024-03-05 DIAGNOSIS — I442 Atrioventricular block, complete: Secondary | ICD-10-CM | POA: Diagnosis not present

## 2024-03-05 LAB — CUP PACEART REMOTE DEVICE CHECK
Battery Remaining Longevity: 109 mo
Battery Voltage: 3.01 V
Brady Statistic AP VP Percent: 99.74 %
Brady Statistic AP VS Percent: 0.02 %
Brady Statistic AS VP Percent: 0.13 %
Brady Statistic AS VS Percent: 0.11 %
Brady Statistic RA Percent Paced: 99.86 %
Brady Statistic RV Percent Paced: 99.87 %
Date Time Interrogation Session: 20250527205539
Implantable Lead Connection Status: 753985
Implantable Lead Connection Status: 753985
Implantable Lead Implant Date: 19980414
Implantable Lead Implant Date: 19980414
Implantable Lead Location: 753859
Implantable Lead Location: 753860
Implantable Pulse Generator Implant Date: 20220817
Lead Channel Impedance Value: 342 Ohm
Lead Channel Impedance Value: 380 Ohm
Lead Channel Impedance Value: 475 Ohm
Lead Channel Impedance Value: 589 Ohm
Lead Channel Pacing Threshold Amplitude: 0.375 V
Lead Channel Pacing Threshold Amplitude: 0.875 V
Lead Channel Pacing Threshold Pulse Width: 0.4 ms
Lead Channel Pacing Threshold Pulse Width: 0.4 ms
Lead Channel Sensing Intrinsic Amplitude: 19.625 mV
Lead Channel Sensing Intrinsic Amplitude: 19.625 mV
Lead Channel Sensing Intrinsic Amplitude: 4.875 mV
Lead Channel Sensing Intrinsic Amplitude: 4.875 mV
Lead Channel Setting Pacing Amplitude: 1.5 V
Lead Channel Setting Pacing Amplitude: 2 V
Lead Channel Setting Pacing Pulse Width: 0.4 ms
Lead Channel Setting Sensing Sensitivity: 2 mV
Zone Setting Status: 755011

## 2024-03-09 ENCOUNTER — Ambulatory Visit: Payer: Self-pay | Admitting: Cardiology

## 2024-04-25 NOTE — Progress Notes (Signed)
 Remote pacemaker transmission.

## 2024-06-04 ENCOUNTER — Ambulatory Visit (INDEPENDENT_AMBULATORY_CARE_PROVIDER_SITE_OTHER): Payer: Medicare Other

## 2024-06-04 DIAGNOSIS — I442 Atrioventricular block, complete: Secondary | ICD-10-CM | POA: Diagnosis not present

## 2024-06-05 LAB — CUP PACEART REMOTE DEVICE CHECK
Battery Remaining Longevity: 109 mo
Battery Voltage: 3 V
Brady Statistic AP VP Percent: 99.71 %
Brady Statistic AP VS Percent: 0.03 %
Brady Statistic AS VP Percent: 0.11 %
Brady Statistic AS VS Percent: 0.15 %
Brady Statistic RA Percent Paced: 99.88 %
Brady Statistic RV Percent Paced: 99.82 %
Date Time Interrogation Session: 20250826224444
Implantable Lead Connection Status: 753985
Implantable Lead Connection Status: 753985
Implantable Lead Implant Date: 19980414
Implantable Lead Implant Date: 19980414
Implantable Lead Location: 753859
Implantable Lead Location: 753860
Implantable Pulse Generator Implant Date: 20220817
Lead Channel Impedance Value: 361 Ohm
Lead Channel Impedance Value: 380 Ohm
Lead Channel Impedance Value: 570 Ohm
Lead Channel Impedance Value: 684 Ohm
Lead Channel Pacing Threshold Amplitude: 0.375 V
Lead Channel Pacing Threshold Amplitude: 0.875 V
Lead Channel Pacing Threshold Pulse Width: 0.4 ms
Lead Channel Pacing Threshold Pulse Width: 0.4 ms
Lead Channel Sensing Intrinsic Amplitude: 16.875 mV
Lead Channel Sensing Intrinsic Amplitude: 16.875 mV
Lead Channel Sensing Intrinsic Amplitude: 5 mV
Lead Channel Sensing Intrinsic Amplitude: 5 mV
Lead Channel Setting Pacing Amplitude: 1.5 V
Lead Channel Setting Pacing Amplitude: 2 V
Lead Channel Setting Pacing Pulse Width: 0.4 ms
Lead Channel Setting Sensing Sensitivity: 2 mV
Zone Setting Status: 755011

## 2024-06-08 ENCOUNTER — Ambulatory Visit: Payer: Self-pay | Admitting: Cardiology

## 2024-06-23 NOTE — Progress Notes (Signed)
 Remote PPM Transmission

## 2024-09-03 ENCOUNTER — Ambulatory Visit: Payer: Medicare Other

## 2024-09-03 DIAGNOSIS — I495 Sick sinus syndrome: Secondary | ICD-10-CM | POA: Diagnosis not present

## 2024-09-03 LAB — CUP PACEART REMOTE DEVICE CHECK
Battery Remaining Longevity: 104 mo
Battery Voltage: 3 V
Brady Statistic AP VP Percent: 99.48 %
Brady Statistic AP VS Percent: 0.08 %
Brady Statistic AS VP Percent: 0.16 %
Brady Statistic AS VS Percent: 0.27 %
Brady Statistic RA Percent Paced: 99.83 %
Brady Statistic RV Percent Paced: 99.65 %
Date Time Interrogation Session: 20251125224241
Implantable Lead Connection Status: 753985
Implantable Lead Connection Status: 753985
Implantable Lead Implant Date: 19980414
Implantable Lead Implant Date: 19980414
Implantable Lead Location: 753859
Implantable Lead Location: 753860
Implantable Pulse Generator Implant Date: 20220817
Lead Channel Impedance Value: 361 Ohm
Lead Channel Impedance Value: 380 Ohm
Lead Channel Impedance Value: 494 Ohm
Lead Channel Impedance Value: 608 Ohm
Lead Channel Pacing Threshold Amplitude: 0.375 V
Lead Channel Pacing Threshold Amplitude: 0.875 V
Lead Channel Pacing Threshold Pulse Width: 0.4 ms
Lead Channel Pacing Threshold Pulse Width: 0.4 ms
Lead Channel Sensing Intrinsic Amplitude: 16.875 mV
Lead Channel Sensing Intrinsic Amplitude: 16.875 mV
Lead Channel Sensing Intrinsic Amplitude: 5 mV
Lead Channel Sensing Intrinsic Amplitude: 5 mV
Lead Channel Setting Pacing Amplitude: 1.5 V
Lead Channel Setting Pacing Amplitude: 2 V
Lead Channel Setting Pacing Pulse Width: 0.4 ms
Lead Channel Setting Sensing Sensitivity: 2 mV
Zone Setting Status: 755011

## 2024-09-04 ENCOUNTER — Ambulatory Visit: Payer: Self-pay | Admitting: Cardiology

## 2024-09-05 NOTE — Progress Notes (Signed)
 Remote PPM Transmission

## 2024-11-05 ENCOUNTER — Telehealth: Payer: Self-pay | Admitting: Emergency Medicine

## 2024-11-05 ENCOUNTER — Encounter: Payer: Self-pay | Admitting: Emergency Medicine

## 2024-11-05 NOTE — Telephone Encounter (Signed)
 Patient was scheduled with Dr. Kennyth on 11/05/2024. Due to Dr. Kennyth having to be out of the office on 1/28, I attempted to contact patient to reschedule on 1/22 and x3 at mobile and home on 1/28. His EC is his wife Rollene, but call could not be completed. Certified letter sent to patient to reschedule 1/28 appointment with EP APP.

## 2024-11-06 ENCOUNTER — Ambulatory Visit: Admitting: Cardiology

## 2024-11-10 NOTE — Telephone Encounter (Signed)
*  late entry*   Patient called in on 1/29 and was rescheduled for May.

## 2025-03-04 ENCOUNTER — Ambulatory Visit: Admitting: Cardiology
